# Patient Record
Sex: Male | Born: 1950 | Race: White | Hispanic: No | Marital: Married | Smoking: Current every day smoker
Health system: Southern US, Community
[De-identification: ages and names within clinical notes are randomized; demographics above are authoritative.]

## PROBLEM LIST (undated history)

## (undated) DIAGNOSIS — C801 Malignant (primary) neoplasm, unspecified: Secondary | ICD-10-CM

## (undated) DIAGNOSIS — J189 Pneumonia, unspecified organism: Secondary | ICD-10-CM

## (undated) DIAGNOSIS — M109 Gout, unspecified: Secondary | ICD-10-CM

## (undated) DIAGNOSIS — F410 Panic disorder [episodic paroxysmal anxiety] without agoraphobia: Secondary | ICD-10-CM

## (undated) DIAGNOSIS — T8859XA Other complications of anesthesia, initial encounter: Secondary | ICD-10-CM

## (undated) DIAGNOSIS — K219 Gastro-esophageal reflux disease without esophagitis: Secondary | ICD-10-CM

## (undated) DIAGNOSIS — M1990 Unspecified osteoarthritis, unspecified site: Secondary | ICD-10-CM

## (undated) DIAGNOSIS — E785 Hyperlipidemia, unspecified: Secondary | ICD-10-CM

## (undated) HISTORY — DX: Hyperlipidemia, unspecified: E78.5

## (undated) HISTORY — PX: COLONOSCOPY: SHX174

## (undated) HISTORY — PX: CARDIAC CATHETERIZATION: SHX172

## (undated) HISTORY — PX: CATARACT EXTRACTION: SUR2

---

## 2007-12-26 ENCOUNTER — Ambulatory Visit: Payer: Self-pay | Admitting: Gastroenterology

## 2008-01-09 ENCOUNTER — Ambulatory Visit: Payer: Self-pay | Admitting: Gastroenterology

## 2017-07-25 DIAGNOSIS — E7800 Pure hypercholesterolemia, unspecified: Secondary | ICD-10-CM | POA: Diagnosis not present

## 2017-07-25 DIAGNOSIS — R82998 Other abnormal findings in urine: Secondary | ICD-10-CM | POA: Diagnosis not present

## 2017-07-25 DIAGNOSIS — M109 Gout, unspecified: Secondary | ICD-10-CM | POA: Diagnosis not present

## 2017-08-01 DIAGNOSIS — Z0000 Encounter for general adult medical examination without abnormal findings: Secondary | ICD-10-CM | POA: Diagnosis not present

## 2017-08-01 DIAGNOSIS — M1000 Idiopathic gout, unspecified site: Secondary | ICD-10-CM | POA: Diagnosis not present

## 2017-08-01 DIAGNOSIS — E668 Other obesity: Secondary | ICD-10-CM | POA: Diagnosis not present

## 2017-08-01 DIAGNOSIS — Z23 Encounter for immunization: Secondary | ICD-10-CM | POA: Diagnosis not present

## 2017-08-01 DIAGNOSIS — R7301 Impaired fasting glucose: Secondary | ICD-10-CM | POA: Diagnosis not present

## 2017-08-01 DIAGNOSIS — Z6830 Body mass index (BMI) 30.0-30.9, adult: Secondary | ICD-10-CM | POA: Diagnosis not present

## 2017-08-01 DIAGNOSIS — E7800 Pure hypercholesterolemia, unspecified: Secondary | ICD-10-CM | POA: Diagnosis not present

## 2017-08-01 DIAGNOSIS — R69 Illness, unspecified: Secondary | ICD-10-CM | POA: Diagnosis not present

## 2017-08-01 DIAGNOSIS — Z1389 Encounter for screening for other disorder: Secondary | ICD-10-CM | POA: Diagnosis not present

## 2017-08-07 DIAGNOSIS — R69 Illness, unspecified: Secondary | ICD-10-CM | POA: Diagnosis not present

## 2017-08-08 DIAGNOSIS — Z1212 Encounter for screening for malignant neoplasm of rectum: Secondary | ICD-10-CM | POA: Diagnosis not present

## 2017-08-29 DIAGNOSIS — H93293 Other abnormal auditory perceptions, bilateral: Secondary | ICD-10-CM | POA: Diagnosis not present

## 2017-10-23 DIAGNOSIS — M109 Gout, unspecified: Secondary | ICD-10-CM | POA: Diagnosis not present

## 2017-10-23 DIAGNOSIS — Z8249 Family history of ischemic heart disease and other diseases of the circulatory system: Secondary | ICD-10-CM | POA: Diagnosis not present

## 2017-10-23 DIAGNOSIS — R69 Illness, unspecified: Secondary | ICD-10-CM | POA: Diagnosis not present

## 2017-10-23 DIAGNOSIS — E785 Hyperlipidemia, unspecified: Secondary | ICD-10-CM | POA: Diagnosis not present

## 2017-10-23 DIAGNOSIS — Z7982 Long term (current) use of aspirin: Secondary | ICD-10-CM | POA: Diagnosis not present

## 2017-10-23 DIAGNOSIS — Z823 Family history of stroke: Secondary | ICD-10-CM | POA: Diagnosis not present

## 2017-10-23 DIAGNOSIS — Z882 Allergy status to sulfonamides status: Secondary | ICD-10-CM | POA: Diagnosis not present

## 2017-10-23 DIAGNOSIS — Z833 Family history of diabetes mellitus: Secondary | ICD-10-CM | POA: Diagnosis not present

## 2017-11-25 DIAGNOSIS — H524 Presbyopia: Secondary | ICD-10-CM | POA: Diagnosis not present

## 2017-12-14 DIAGNOSIS — R69 Illness, unspecified: Secondary | ICD-10-CM | POA: Diagnosis not present

## 2017-12-21 ENCOUNTER — Encounter: Payer: Self-pay | Admitting: Gastroenterology

## 2018-02-06 ENCOUNTER — Encounter: Payer: Self-pay | Admitting: Gastroenterology

## 2018-03-20 ENCOUNTER — Ambulatory Visit (AMBULATORY_SURGERY_CENTER): Payer: Self-pay | Admitting: *Deleted

## 2018-03-20 ENCOUNTER — Encounter: Payer: Self-pay | Admitting: Gastroenterology

## 2018-03-20 ENCOUNTER — Other Ambulatory Visit: Payer: Self-pay

## 2018-03-20 VITALS — Ht 68.0 in | Wt 191.4 lb

## 2018-03-20 DIAGNOSIS — Z1211 Encounter for screening for malignant neoplasm of colon: Secondary | ICD-10-CM

## 2018-03-20 NOTE — Progress Notes (Signed)
No egg or soy allergy known to patient  No issues with past sedation with any surgeries  or procedures, no intubation problems  No diet pills per patient No home 02 use per patient  No blood thinners per patient  Pt denies issues with constipation  No A fib or A flutter  EMMI video sent to pt's e mail  

## 2018-04-02 ENCOUNTER — Encounter: Payer: Self-pay | Admitting: Gastroenterology

## 2018-04-03 ENCOUNTER — Ambulatory Visit (AMBULATORY_SURGERY_CENTER): Payer: Medicare HMO | Admitting: Gastroenterology

## 2018-04-03 ENCOUNTER — Encounter: Payer: Self-pay | Admitting: Gastroenterology

## 2018-04-03 VITALS — BP 105/73 | HR 74 | Temp 98.4°F | Resp 10 | Ht 68.0 in | Wt 191.0 lb

## 2018-04-03 DIAGNOSIS — Z1211 Encounter for screening for malignant neoplasm of colon: Secondary | ICD-10-CM | POA: Diagnosis not present

## 2018-04-03 DIAGNOSIS — E669 Obesity, unspecified: Secondary | ICD-10-CM | POA: Diagnosis not present

## 2018-04-03 DIAGNOSIS — D120 Benign neoplasm of cecum: Secondary | ICD-10-CM | POA: Diagnosis not present

## 2018-04-03 MED ORDER — SODIUM CHLORIDE 0.9 % IV SOLN: 500.0000 mL | Freq: Once | INTRAVENOUS | Status: DC

## 2018-04-03 NOTE — Progress Notes (Signed)
Report to PACU, RN, vss, BBS= Clear.  

## 2018-04-03 NOTE — Patient Instructions (Signed)
*  Handout on polyps, diverticulosis, hemorrhoids given*   YOU HAD AN ENDOSCOPIC PROCEDURE TODAY AT [facility]:   Refer to the procedure report that was given to you for any specific questions about what was found during the examination.  If the procedure report does not answer your questions, please call your gastroenterologist to clarify.  If you requested that your care partner not be given the details of your procedure findings, then the procedure report has been included in a sealed envelope for you to review at your convenience later.  YOU SHOULD EXPECT: Some feelings of bloating in the abdomen. Passage of more gas than usual.  Walking can help get rid of the air that was put into your GI tract during the procedure and reduce the bloating. If you had a lower endoscopy (such as a colonoscopy or flexible sigmoidoscopy) you may notice spotting of blood in your stool or on the toilet paper. If you underwent a bowel prep for your procedure, you may not have a normal bowel movement for a few days.  Please Note:  You might notice some irritation and congestion in your nose or some drainage.  This is from the oxygen used during your procedure.  There is no need for concern and it should clear up in a day or so.  SYMPTOMS TO REPORT IMMEDIATELY:   Following lower endoscopy (colonoscopy or flexible sigmoidoscopy):  Excessive amounts of blood in the stool  Significant tenderness or worsening of abdominal pains  Swelling of the abdomen that is new, acute  Fever of 100F or higher   For urgent or emergent issues, a gastroenterologist can be reached at any hour by calling [phone].   DIET:  We do recommend a small meal at first, but then you may proceed to your regular diet.  Drink plenty of fluids but you should avoid alcoholic beverages for 24 hours.  ACTIVITY:  You should plan to take it easy for the rest of today and you should NOT DRIVE or use heavy machinery until tomorrow  (because of the sedation medicines used during the test).    FOLLOW UP: Our staff will call the number listed on your records the next business day following your procedure to check on you and address any questions or concerns that you may have regarding the information given to you following your procedure. If we do not reach you, we will leave a message.  However, if you are feeling well and you are not experiencing any problems, there is no need to return our call.  We will assume that you have returned to your regular daily activities without incident.  If any biopsies were taken you will be contacted by phone or by letter within the next 1-3 weeks.  Please call us at [phone] if you have not heard about the biopsies in 3 weeks.    SIGNATURES/CONFIDENTIALITY: You and/or your care partner have signed paperwork which will be entered into your electronic medical record.  These signatures attest to the fact that that the information above on your After Visit Summary has been reviewed and is understood.  Full responsibility of the confidentiality of this discharge information lies with you and/or your care-partner.

## 2018-04-03 NOTE — Op Note (Signed)
[facility] Patient Name: Jacob Cox Procedure Date: 04/03/2018 7:57 AM MRN: [id number] Endoscopist: Remo Lipps P. Havery Moros , MD Age: 67 Referring MD:  Date of Birth: 26-Feb-1951 Gender: Male Account #: [id number] Procedure:                Colonoscopy Indications:              Screening for colorectal malignant neoplasm Medicines:                Monitored Anesthesia Care Procedure:                Pre-Anesthesia Assessment:                           - Prior to the procedure, a History and Physical                            was performed, and patient medications and                            allergies were reviewed. The patient's tolerance of                            previous anesthesia was also reviewed. The risks                            and benefits of the procedure and the sedation                            options and risks were discussed with the patient.                            All questions were answered, and informed consent                            was obtained. Prior Anticoagulants: The patient has                            taken no previous anticoagulant or antiplatelet                            agents. ASA Grade Assessment: II - A patient with                            mild systemic disease. After reviewing the risks                            and benefits, the patient was deemed in                            satisfactory condition to undergo the procedure.                           After obtaining informed consent, the colonoscope  was passed under direct vision. Throughout the                            procedure, the patient's blood pressure, pulse, and                            oxygen saturations were monitored continuously. The                            Colonoscope was introduced through the anus and                            advanced to the the cecum, identified by                            appendiceal orifice  and ileocecal valve. The                            colonoscopy was performed without difficulty. The                            patient tolerated the procedure well. The quality                            of the bowel preparation was adequate. The                            ileocecal valve, appendiceal orifice, and rectum                            were photographed. Scope In: 7:59:26 AM Scope Out: 8:24:13 AM Scope Withdrawal Time: 0 hours 20 minutes 58 seconds  Total Procedure Duration: 0 hours 24 minutes 47 seconds  Findings:                 The perianal and digital rectal examinations were                            normal.                           A 3 mm polyp was found in the cecum. The polyp was                            sessile. The polyp was removed with a cold snare.                            Resection and retrieval were complete.                           A 8 mm polyp was found in the cecum. The polyp was                            flat. The polyp was removed with a cold snare.  Resection and retrieval were complete.                           Multiple medium-mouthed diverticula were found in                            the left colon.                           Internal hemorrhoids were found during retroflexion.                           The exam was otherwise without abnormality. Complications:            No immediate complications. Estimated blood loss:                            Minimal. Estimated Blood Loss:     Estimated blood loss was minimal. Impression:               - One 3 mm polyp in the cecum, removed with a cold                            snare. Resected and retrieved.                           - One 8 mm polyp in the cecum, removed with a cold                            snare. Resected and retrieved.                           - Diverticulosis in the left colon.                           - Internal hemorrhoids.                            - The examination was otherwise normal. Recommendation:           - Patient has a contact number available for                            emergencies. The signs and symptoms of potential                            delayed complications were discussed with the                            patient. Return to normal activities tomorrow.                            Written discharge instructions were provided to the                            patient.                           -  Resume previous diet.                           - Continue present medications.                           - Await pathology results.                           - Repeat colonoscopy for surveillance based on                            pathology results. Remo Lipps P. Armbruster, MD 04/03/2018 8:30:11 AM This report has been signed electronically.

## 2018-04-03 NOTE — Progress Notes (Signed)
Called to room to assist during endoscopic procedure.  Patient ID and intended procedure confirmed with present staff. Received instructions for my participation in the procedure from the performing physician.  

## 2018-04-04 ENCOUNTER — Telehealth: Payer: Self-pay

## 2018-04-04 NOTE — Telephone Encounter (Signed)
  Follow up Call-  Call back number 04/03/2018  Post procedure Call Back phone  # [phone]  Permission to leave phone message Yes  Some recent data might be hidden     Patient questions:  Do you have a fever, pain , or abdominal swelling? No. Pain Score  0 *  Have you tolerated food without any problems? Yes.    Have you been able to return to your normal activities? Yes.    Do you have any questions about your discharge instructions: Diet   No. Medications  No. Follow up visit  No.  Do you have questions or concerns about your Care? No.  Actions: * If pain score is 4 or above: No action needed, pain <4.  No problems noted per pt. maw

## 2018-04-04 NOTE — Telephone Encounter (Signed)
Called #[phone] and left a messaged we tried to reach pt for a follow up call. maw

## 2018-04-05 DIAGNOSIS — L821 Other seborrheic keratosis: Secondary | ICD-10-CM | POA: Diagnosis not present

## 2018-04-05 DIAGNOSIS — L570 Actinic keratosis: Secondary | ICD-10-CM | POA: Diagnosis not present

## 2018-04-05 DIAGNOSIS — L820 Inflamed seborrheic keratosis: Secondary | ICD-10-CM | POA: Diagnosis not present

## 2018-04-05 DIAGNOSIS — Z23 Encounter for immunization: Secondary | ICD-10-CM | POA: Diagnosis not present

## 2018-04-10 ENCOUNTER — Encounter: Payer: Self-pay | Admitting: Gastroenterology

## 2018-04-26 DIAGNOSIS — R69 Illness, unspecified: Secondary | ICD-10-CM | POA: Diagnosis not present

## 2018-06-26 DIAGNOSIS — B029 Zoster without complications: Secondary | ICD-10-CM | POA: Diagnosis not present

## 2018-08-16 DIAGNOSIS — R972 Elevated prostate specific antigen [PSA]: Secondary | ICD-10-CM | POA: Diagnosis not present

## 2018-08-16 DIAGNOSIS — R82998 Other abnormal findings in urine: Secondary | ICD-10-CM | POA: Diagnosis not present

## 2018-08-16 DIAGNOSIS — R7301 Impaired fasting glucose: Secondary | ICD-10-CM | POA: Diagnosis not present

## 2018-08-16 DIAGNOSIS — Z125 Encounter for screening for malignant neoplasm of prostate: Secondary | ICD-10-CM | POA: Diagnosis not present

## 2018-08-16 DIAGNOSIS — M109 Gout, unspecified: Secondary | ICD-10-CM | POA: Diagnosis not present

## 2018-08-16 DIAGNOSIS — E7800 Pure hypercholesterolemia, unspecified: Secondary | ICD-10-CM | POA: Diagnosis not present

## 2018-08-23 DIAGNOSIS — Z23 Encounter for immunization: Secondary | ICD-10-CM | POA: Diagnosis not present

## 2018-08-23 DIAGNOSIS — Z0000 Encounter for general adult medical examination without abnormal findings: Secondary | ICD-10-CM | POA: Diagnosis not present

## 2018-08-23 DIAGNOSIS — Z1331 Encounter for screening for depression: Secondary | ICD-10-CM | POA: Diagnosis not present

## 2018-08-23 DIAGNOSIS — R69 Illness, unspecified: Secondary | ICD-10-CM | POA: Diagnosis not present

## 2018-08-23 DIAGNOSIS — R972 Elevated prostate specific antigen [PSA]: Secondary | ICD-10-CM | POA: Diagnosis not present

## 2018-08-23 DIAGNOSIS — M109 Gout, unspecified: Secondary | ICD-10-CM | POA: Diagnosis not present

## 2018-08-23 DIAGNOSIS — E668 Other obesity: Secondary | ICD-10-CM | POA: Diagnosis not present

## 2018-08-23 DIAGNOSIS — R7301 Impaired fasting glucose: Secondary | ICD-10-CM | POA: Diagnosis not present

## 2018-08-23 DIAGNOSIS — H9193 Unspecified hearing loss, bilateral: Secondary | ICD-10-CM | POA: Diagnosis not present

## 2018-08-23 DIAGNOSIS — E7800 Pure hypercholesterolemia, unspecified: Secondary | ICD-10-CM | POA: Diagnosis not present

## 2018-08-24 DIAGNOSIS — Z1212 Encounter for screening for malignant neoplasm of rectum: Secondary | ICD-10-CM | POA: Diagnosis not present

## 2018-09-06 DIAGNOSIS — R69 Illness, unspecified: Secondary | ICD-10-CM | POA: Diagnosis not present

## 2018-09-24 DIAGNOSIS — R972 Elevated prostate specific antigen [PSA]: Secondary | ICD-10-CM | POA: Diagnosis not present

## 2019-01-15 DIAGNOSIS — R69 Illness, unspecified: Secondary | ICD-10-CM | POA: Diagnosis not present

## 2019-02-13 DIAGNOSIS — B349 Viral infection, unspecified: Secondary | ICD-10-CM | POA: Diagnosis not present

## 2019-02-13 DIAGNOSIS — R0600 Dyspnea, unspecified: Secondary | ICD-10-CM | POA: Diagnosis not present

## 2019-04-03 DIAGNOSIS — R69 Illness, unspecified: Secondary | ICD-10-CM | POA: Diagnosis not present

## 2019-04-13 DIAGNOSIS — Z23 Encounter for immunization: Secondary | ICD-10-CM | POA: Diagnosis not present

## 2019-04-16 DIAGNOSIS — R69 Illness, unspecified: Secondary | ICD-10-CM | POA: Diagnosis not present

## 2019-04-22 DIAGNOSIS — R69 Illness, unspecified: Secondary | ICD-10-CM | POA: Diagnosis not present

## 2019-05-21 DIAGNOSIS — R69 Illness, unspecified: Secondary | ICD-10-CM | POA: Diagnosis not present

## 2019-08-26 DIAGNOSIS — M109 Gout, unspecified: Secondary | ICD-10-CM | POA: Diagnosis not present

## 2019-08-26 DIAGNOSIS — E7800 Pure hypercholesterolemia, unspecified: Secondary | ICD-10-CM | POA: Diagnosis not present

## 2019-08-26 DIAGNOSIS — Z125 Encounter for screening for malignant neoplasm of prostate: Secondary | ICD-10-CM | POA: Diagnosis not present

## 2019-08-26 DIAGNOSIS — R7301 Impaired fasting glucose: Secondary | ICD-10-CM | POA: Diagnosis not present

## 2019-08-26 DIAGNOSIS — Z0000 Encounter for general adult medical examination without abnormal findings: Secondary | ICD-10-CM | POA: Diagnosis not present

## 2019-08-28 DIAGNOSIS — Z1212 Encounter for screening for malignant neoplasm of rectum: Secondary | ICD-10-CM | POA: Diagnosis not present

## 2019-08-28 DIAGNOSIS — R82998 Other abnormal findings in urine: Secondary | ICD-10-CM | POA: Diagnosis not present

## 2019-08-29 DIAGNOSIS — R69 Illness, unspecified: Secondary | ICD-10-CM | POA: Diagnosis not present

## 2019-09-02 DIAGNOSIS — R7301 Impaired fasting glucose: Secondary | ICD-10-CM | POA: Diagnosis not present

## 2019-09-02 DIAGNOSIS — R69 Illness, unspecified: Secondary | ICD-10-CM | POA: Diagnosis not present

## 2019-09-02 DIAGNOSIS — M109 Gout, unspecified: Secondary | ICD-10-CM | POA: Diagnosis not present

## 2019-09-02 DIAGNOSIS — Z0000 Encounter for general adult medical examination without abnormal findings: Secondary | ICD-10-CM | POA: Diagnosis not present

## 2019-09-02 DIAGNOSIS — E669 Obesity, unspecified: Secondary | ICD-10-CM | POA: Diagnosis not present

## 2019-09-02 DIAGNOSIS — H9193 Unspecified hearing loss, bilateral: Secondary | ICD-10-CM | POA: Diagnosis not present

## 2019-09-02 DIAGNOSIS — Z7689 Persons encountering health services in other specified circumstances: Secondary | ICD-10-CM | POA: Diagnosis not present

## 2019-09-02 DIAGNOSIS — M1990 Unspecified osteoarthritis, unspecified site: Secondary | ICD-10-CM | POA: Diagnosis not present

## 2019-09-02 DIAGNOSIS — N390 Urinary tract infection, site not specified: Secondary | ICD-10-CM | POA: Diagnosis not present

## 2019-09-02 DIAGNOSIS — E7800 Pure hypercholesterolemia, unspecified: Secondary | ICD-10-CM | POA: Diagnosis not present

## 2019-09-02 DIAGNOSIS — E663 Overweight: Secondary | ICD-10-CM | POA: Diagnosis not present

## 2019-09-05 ENCOUNTER — Ambulatory Visit: Payer: Medicare HMO | Attending: Internal Medicine

## 2019-09-05 DIAGNOSIS — Z23 Encounter for immunization: Secondary | ICD-10-CM | POA: Insufficient documentation

## 2019-09-05 NOTE — Progress Notes (Signed)
   Covid-19 Vaccination Clinic  Name:  RODERICH COCKCROFT    MRN: [id number] DOB: 1950/09/27  09/05/2019  Mr. Hubley was observed post Covid-19 immunization for 15 minutes without incident. He was provided with Vaccine Information Sheet and instruction to access the V-Safe system.   Mr. Garthwaite was instructed to call 911 with any severe reactions post vaccine: Marland Kitchen Difficulty breathing  . Swelling of face and throat  . A fast heartbeat  . A bad rash all over body  . Dizziness and weakness

## 2019-09-17 DIAGNOSIS — E785 Hyperlipidemia, unspecified: Secondary | ICD-10-CM | POA: Diagnosis not present

## 2019-09-17 DIAGNOSIS — K219 Gastro-esophageal reflux disease without esophagitis: Secondary | ICD-10-CM | POA: Diagnosis not present

## 2019-09-17 DIAGNOSIS — Z882 Allergy status to sulfonamides status: Secondary | ICD-10-CM | POA: Diagnosis not present

## 2019-09-17 DIAGNOSIS — R69 Illness, unspecified: Secondary | ICD-10-CM | POA: Diagnosis not present

## 2019-09-17 DIAGNOSIS — Z823 Family history of stroke: Secondary | ICD-10-CM | POA: Diagnosis not present

## 2019-09-17 DIAGNOSIS — Z7982 Long term (current) use of aspirin: Secondary | ICD-10-CM | POA: Diagnosis not present

## 2019-09-17 DIAGNOSIS — R030 Elevated blood-pressure reading, without diagnosis of hypertension: Secondary | ICD-10-CM | POA: Diagnosis not present

## 2019-09-17 DIAGNOSIS — M109 Gout, unspecified: Secondary | ICD-10-CM | POA: Diagnosis not present

## 2019-10-01 ENCOUNTER — Ambulatory Visit: Payer: Medicare HMO | Attending: Internal Medicine

## 2019-10-01 DIAGNOSIS — Z23 Encounter for immunization: Secondary | ICD-10-CM

## 2019-10-01 NOTE — Progress Notes (Signed)
   Covid-19 Vaccination Clinic  Name:  Jacob Cox    MRN: [id number] DOB: 10/29/1950  10/01/2019  [name] was observed post Covid-19 immunization for 15 minutes without incident. He was provided with Vaccine Information Sheet and instruction to access the V-Safe system.   [name] was instructed to call 911 with any severe reactions post vaccine: Marland Kitchen Difficulty breathing  . Swelling of face and throat  . A fast heartbeat  . A bad rash all over body  . Dizziness and weakness   Immunizations Administered    Name Date Dose VIS Date Route   Pfizer COVID-19 Vaccine 10/01/2019 11:26 AM 0.3 mL 06/14/2019 Intramuscular   Manufacturer: [place]   Lot: [id number]   [place]: [id number]

## 2019-10-26 DIAGNOSIS — H524 Presbyopia: Secondary | ICD-10-CM | POA: Diagnosis not present

## 2019-11-11 DIAGNOSIS — R69 Illness, unspecified: Secondary | ICD-10-CM | POA: Diagnosis not present

## 2020-02-19 DIAGNOSIS — R69 Illness, unspecified: Secondary | ICD-10-CM | POA: Diagnosis not present

## 2020-04-10 DIAGNOSIS — Z23 Encounter for immunization: Secondary | ICD-10-CM | POA: Diagnosis not present

## 2020-05-27 DIAGNOSIS — R69 Illness, unspecified: Secondary | ICD-10-CM | POA: Diagnosis not present

## 2020-06-22 DIAGNOSIS — D0339 Melanoma in situ of other parts of face: Secondary | ICD-10-CM | POA: Diagnosis not present

## 2020-06-22 DIAGNOSIS — D0439 Carcinoma in situ of skin of other parts of face: Secondary | ICD-10-CM | POA: Diagnosis not present

## 2020-06-22 DIAGNOSIS — D225 Melanocytic nevi of trunk: Secondary | ICD-10-CM | POA: Diagnosis not present

## 2020-06-22 DIAGNOSIS — D034 Melanoma in situ of scalp and neck: Secondary | ICD-10-CM | POA: Diagnosis not present

## 2020-07-23 DIAGNOSIS — D485 Neoplasm of uncertain behavior of skin: Secondary | ICD-10-CM | POA: Diagnosis not present

## 2020-07-23 DIAGNOSIS — D0330 Melanoma in situ of unspecified part of face: Secondary | ICD-10-CM | POA: Diagnosis not present

## 2020-08-07 DIAGNOSIS — Z8249 Family history of ischemic heart disease and other diseases of the circulatory system: Secondary | ICD-10-CM | POA: Diagnosis not present

## 2020-08-07 DIAGNOSIS — E663 Overweight: Secondary | ICD-10-CM | POA: Diagnosis not present

## 2020-08-07 DIAGNOSIS — E785 Hyperlipidemia, unspecified: Secondary | ICD-10-CM | POA: Diagnosis not present

## 2020-08-07 DIAGNOSIS — R69 Illness, unspecified: Secondary | ICD-10-CM | POA: Diagnosis not present

## 2020-08-07 DIAGNOSIS — Z809 Family history of malignant neoplasm, unspecified: Secondary | ICD-10-CM | POA: Diagnosis not present

## 2020-08-07 DIAGNOSIS — Z823 Family history of stroke: Secondary | ICD-10-CM | POA: Diagnosis not present

## 2020-08-07 DIAGNOSIS — M1990 Unspecified osteoarthritis, unspecified site: Secondary | ICD-10-CM | POA: Diagnosis not present

## 2020-08-07 DIAGNOSIS — Z7982 Long term (current) use of aspirin: Secondary | ICD-10-CM | POA: Diagnosis not present

## 2020-08-07 DIAGNOSIS — M109 Gout, unspecified: Secondary | ICD-10-CM | POA: Diagnosis not present

## 2020-08-31 DIAGNOSIS — M109 Gout, unspecified: Secondary | ICD-10-CM | POA: Diagnosis not present

## 2020-08-31 DIAGNOSIS — E7800 Pure hypercholesterolemia, unspecified: Secondary | ICD-10-CM | POA: Diagnosis not present

## 2020-08-31 DIAGNOSIS — Z125 Encounter for screening for malignant neoplasm of prostate: Secondary | ICD-10-CM | POA: Diagnosis not present

## 2020-08-31 DIAGNOSIS — R7301 Impaired fasting glucose: Secondary | ICD-10-CM | POA: Diagnosis not present

## 2020-09-01 DIAGNOSIS — Z0100 Encounter for examination of eyes and vision without abnormal findings: Secondary | ICD-10-CM | POA: Diagnosis not present

## 2020-09-01 DIAGNOSIS — H524 Presbyopia: Secondary | ICD-10-CM | POA: Diagnosis not present

## 2020-09-07 DIAGNOSIS — M109 Gout, unspecified: Secondary | ICD-10-CM | POA: Diagnosis not present

## 2020-09-07 DIAGNOSIS — Z0000 Encounter for general adult medical examination without abnormal findings: Secondary | ICD-10-CM | POA: Diagnosis not present

## 2020-09-07 DIAGNOSIS — Z1212 Encounter for screening for malignant neoplasm of rectum: Secondary | ICD-10-CM | POA: Diagnosis not present

## 2020-09-07 DIAGNOSIS — H9193 Unspecified hearing loss, bilateral: Secondary | ICD-10-CM | POA: Diagnosis not present

## 2020-09-07 DIAGNOSIS — E663 Overweight: Secondary | ICD-10-CM | POA: Diagnosis not present

## 2020-09-07 DIAGNOSIS — R7301 Impaired fasting glucose: Secondary | ICD-10-CM | POA: Diagnosis not present

## 2020-09-07 DIAGNOSIS — M1990 Unspecified osteoarthritis, unspecified site: Secondary | ICD-10-CM | POA: Diagnosis not present

## 2020-09-07 DIAGNOSIS — R82998 Other abnormal findings in urine: Secondary | ICD-10-CM | POA: Diagnosis not present

## 2020-09-07 DIAGNOSIS — E7800 Pure hypercholesterolemia, unspecified: Secondary | ICD-10-CM | POA: Diagnosis not present

## 2020-09-07 DIAGNOSIS — G4733 Obstructive sleep apnea (adult) (pediatric): Secondary | ICD-10-CM | POA: Diagnosis not present

## 2020-09-07 DIAGNOSIS — R69 Illness, unspecified: Secondary | ICD-10-CM | POA: Diagnosis not present

## 2020-09-07 DIAGNOSIS — R9431 Abnormal electrocardiogram [ECG] [EKG]: Secondary | ICD-10-CM | POA: Diagnosis not present

## 2020-09-19 DIAGNOSIS — H524 Presbyopia: Secondary | ICD-10-CM | POA: Diagnosis not present

## 2020-09-29 DIAGNOSIS — D1801 Hemangioma of skin and subcutaneous tissue: Secondary | ICD-10-CM | POA: Diagnosis not present

## 2020-09-29 DIAGNOSIS — D225 Melanocytic nevi of trunk: Secondary | ICD-10-CM | POA: Diagnosis not present

## 2020-09-29 DIAGNOSIS — Z85820 Personal history of malignant melanoma of skin: Secondary | ICD-10-CM | POA: Diagnosis not present

## 2020-09-29 DIAGNOSIS — L820 Inflamed seborrheic keratosis: Secondary | ICD-10-CM | POA: Diagnosis not present

## 2020-09-29 DIAGNOSIS — L245 Irritant contact dermatitis due to other chemical products: Secondary | ICD-10-CM | POA: Diagnosis not present

## 2020-09-29 DIAGNOSIS — D485 Neoplasm of uncertain behavior of skin: Secondary | ICD-10-CM | POA: Diagnosis not present

## 2020-09-29 DIAGNOSIS — L821 Other seborrheic keratosis: Secondary | ICD-10-CM | POA: Diagnosis not present

## 2020-09-29 DIAGNOSIS — Z85828 Personal history of other malignant neoplasm of skin: Secondary | ICD-10-CM | POA: Diagnosis not present

## 2021-01-01 DIAGNOSIS — D2372 Other benign neoplasm of skin of left lower limb, including hip: Secondary | ICD-10-CM | POA: Diagnosis not present

## 2021-01-01 DIAGNOSIS — Z85820 Personal history of malignant melanoma of skin: Secondary | ICD-10-CM | POA: Diagnosis not present

## 2021-01-01 DIAGNOSIS — L738 Other specified follicular disorders: Secondary | ICD-10-CM | POA: Diagnosis not present

## 2021-01-01 DIAGNOSIS — L814 Other melanin hyperpigmentation: Secondary | ICD-10-CM | POA: Diagnosis not present

## 2021-01-01 DIAGNOSIS — D485 Neoplasm of uncertain behavior of skin: Secondary | ICD-10-CM | POA: Diagnosis not present

## 2021-01-01 DIAGNOSIS — D2262 Melanocytic nevi of left upper limb, including shoulder: Secondary | ICD-10-CM | POA: Diagnosis not present

## 2021-01-01 DIAGNOSIS — L821 Other seborrheic keratosis: Secondary | ICD-10-CM | POA: Diagnosis not present

## 2021-01-01 DIAGNOSIS — D2271 Melanocytic nevi of right lower limb, including hip: Secondary | ICD-10-CM | POA: Diagnosis not present

## 2021-01-01 DIAGNOSIS — L988 Other specified disorders of the skin and subcutaneous tissue: Secondary | ICD-10-CM | POA: Diagnosis not present

## 2021-01-01 DIAGNOSIS — D1801 Hemangioma of skin and subcutaneous tissue: Secondary | ICD-10-CM | POA: Diagnosis not present

## 2021-05-02 ENCOUNTER — Other Ambulatory Visit: Payer: Self-pay

## 2021-05-02 ENCOUNTER — Encounter (HOSPITAL_BASED_OUTPATIENT_CLINIC_OR_DEPARTMENT_OTHER): Payer: Self-pay

## 2021-05-02 ENCOUNTER — Emergency Department (HOSPITAL_BASED_OUTPATIENT_CLINIC_OR_DEPARTMENT_OTHER)
Admission: EM | Admit: 2021-05-02 | Discharge: 2021-05-02 | Disposition: A | Discharge status: Home / Self Care | Payer: Medicare HMO | Attending: Emergency Medicine | Admitting: Emergency Medicine

## 2021-05-02 ENCOUNTER — Emergency Department (HOSPITAL_BASED_OUTPATIENT_CLINIC_OR_DEPARTMENT_OTHER): Payer: Medicare HMO | Admitting: Radiology

## 2021-05-02 DIAGNOSIS — R079 Chest pain, unspecified: Secondary | ICD-10-CM | POA: Insufficient documentation

## 2021-05-02 DIAGNOSIS — R0789 Other chest pain: Secondary | ICD-10-CM | POA: Diagnosis not present

## 2021-05-02 DIAGNOSIS — Z7982 Long term (current) use of aspirin: Secondary | ICD-10-CM | POA: Diagnosis not present

## 2021-05-02 DIAGNOSIS — Z743 Need for continuous supervision: Secondary | ICD-10-CM | POA: Diagnosis not present

## 2021-05-02 DIAGNOSIS — F17290 Nicotine dependence, other tobacco product, uncomplicated: Secondary | ICD-10-CM | POA: Insufficient documentation

## 2021-05-02 DIAGNOSIS — R040 Epistaxis: Secondary | ICD-10-CM | POA: Insufficient documentation

## 2021-05-02 DIAGNOSIS — R69 Illness, unspecified: Secondary | ICD-10-CM | POA: Diagnosis not present

## 2021-05-02 DIAGNOSIS — I10 Essential (primary) hypertension: Secondary | ICD-10-CM | POA: Diagnosis not present

## 2021-05-02 DIAGNOSIS — R58 Hemorrhage, not elsewhere classified: Secondary | ICD-10-CM | POA: Diagnosis not present

## 2021-05-02 HISTORY — DX: Panic disorder (episodic paroxysmal anxiety): F41.0

## 2021-05-02 LAB — TROPONIN I (HIGH SENSITIVITY)
Troponin I (High Sensitivity): 3 ng/L (ref <18)
Troponin I (High Sensitivity): 3 ng/L (ref <18)

## 2021-05-02 LAB — CBC
HCT: 44.2 % (ref 39.0–52.0)
Hemoglobin: 15.2 g/dL (ref 13.0–17.0)
MCH: 32.5 pg (ref 26.0–34.0)
MCHC: 34.4 g/dL (ref 30.0–36.0)
MCV: 94.4 fL (ref 80.0–100.0)
Platelets: 191 10*3/uL (ref 150–400)
RBC: 4.68 MIL/uL (ref 4.22–5.81)
RDW: 12.2 % (ref 11.5–15.5)
WBC: 8.8 10*3/uL (ref 4.0–10.5)
nRBC: 0 % (ref 0.0–0.2)

## 2021-05-02 LAB — BASIC METABOLIC PANEL
Anion gap: 13 (ref 5–15)
BUN: 21 mg/dL (ref 8–23)
CO2: 21 mmol/L — ABNORMAL LOW (ref 22–32)
Calcium: 9.1 mg/dL (ref 8.9–10.3)
Chloride: 105 mmol/L (ref 98–111)
Creatinine, Ser: 1.34 mg/dL — ABNORMAL HIGH (ref 0.61–1.24)
GFR, Estimated: 58 mL/min — ABNORMAL LOW (ref >60)
Glucose, Bld: 91 mg/dL (ref 70–99)
Potassium: 4.2 mmol/L (ref 3.5–5.1)
Sodium: 139 mmol/L (ref 135–145)

## 2021-05-02 NOTE — ED Triage Notes (Signed)
Pt now reports he's experiencing intermittent eft side chest pain and a tension headache he feels is radiating down into his chest

## 2021-05-02 NOTE — ED Provider Notes (Signed)
[facility] EMERGENCY DEPT Provider Note   CSN: [id number] Arrival date & time: 05/02/21  1517     History Chief Complaint  Patient presents with   Epistaxis   Chest Pain    Jacob Cox is a 70 y.o. male.  Presents ER chief complaint of bloody nose.  He states he was sitting at his desk when he suddenly noticed dripping noticed blood coming from both nostrils.  He tried to pinch his nose but could not stop the bleeding so called the ambulance and was brought to the ER.  Upon arrival here he developed chest pain described as left-sided chest pain the last for 5 to 10 minutes and then has since resolved.  Otherwise denies any recent illnesses such as fevers or cough or vomiting or diarrhea.  Denies any nasal trauma.      Past Medical History:  Diagnosis Date   Hyperlipidemia    Panic attacks     There are no problems to display for this patient.   Past Surgical History:  Procedure Laterality Date   CARDIAC CATHETERIZATION         Family History  Problem Relation Age of Onset   Colon cancer Neg Hx    Esophageal cancer Neg Hx    Stomach cancer Neg Hx    Esophageal varices Neg Hx     Social History   Tobacco Use   Smoking status: Every Day    Types: Cigars   Smokeless tobacco: Never  Substance Use Topics   Alcohol use: Yes    Comment: 7-8 oz of alcohol per week   Drug use: Never    Home Medications Prior to Admission medications   Medication Sig Start Date End Date Taking? Authorizing Provider  allopurinol (ZYLOPRIM) 100 MG tablet Take 100 mg by mouth daily.    [provider]  aspirin EC 81 MG tablet Take 81 mg by mouth daily.    [provider]  Coenzyme Q10 (COQ10) 200 MG CAPS Take by mouth.    [provider]  Multiple Vitamin (MULTIVITAMIN) tablet Take 1 tablet by mouth daily.    [provider]  Multiple Vitamins-Minerals (ZINC PO) Take by mouth.    [provider]  Omega-3 Fatty Acids  (FISH OIL PO) Take by mouth.    [provider]  simvastatin (ZOCOR) 40 MG tablet Take 40 mg by mouth daily.    [provider]  UNABLE TO FIND Chiroklenz tea with psyllium husk every pm    [provider]    Allergies    Sulfonamide derivatives  Review of Systems   Review of Systems  Constitutional:  Negative for fever.  HENT:  Negative for ear pain and sore throat.   Eyes:  Negative for pain.  Respiratory:  Negative for cough.   Cardiovascular:  Positive for chest pain.  Gastrointestinal:  Negative for abdominal pain.  Genitourinary:  Negative for flank pain.  Musculoskeletal:  Negative for back pain.  Skin:  Negative for color change and rash.  Neurological:  Negative for syncope.  All other systems reviewed and are negative.  Physical Exam Updated Vital Signs BP (!) 137/93   Pulse 83   Temp 97.9 F (36.6 C)   Resp 18   Ht 5\' 8"  (1.727 m)   Wt 84.4 kg   SpO2 99%   BMI 28.28 kg/m   Physical Exam Constitutional:      Appearance: He is well-developed.  HENT:     Head:  Normocephalic.     Nose: Nose normal.     Comments: No active bleeding seen in bilateral nostrils. Eyes:     Extraocular Movements: Extraocular movements intact.  Cardiovascular:     Rate and Rhythm: Normal rate.  Pulmonary:     Effort: Pulmonary effort is normal.  Skin:    Coloration: Skin is not jaundiced.  Neurological:     Mental Status: He is alert. Mental status is at baseline.    ED Results / Procedures / Treatments   Labs (all labs ordered are listed, but only abnormal results are displayed) Labs Reviewed  BASIC METABOLIC PANEL - Abnormal; Notable for the following components:      Result Value   CO2 21 (*)    Creatinine, Ser 1.34 (*)    GFR, Estimated 58 (*)    All other components within normal limits  CBC  TROPONIN I (HIGH SENSITIVITY)  TROPONIN I (HIGH SENSITIVITY)    EKG EKG Interpretation  Date/Time:  Sunday May 02 2021 16:10:47  EDT Ventricular Rate:  103 PR Interval:  146 QRS Duration: 78 QT Interval:  330 QTC Calculation: 432 R Axis:   34 Text Interpretation: Sinus tachycardia Otherwise normal ECG Confirmed by Thamas Jaegers ([id number]) on 05/02/2021 6:19:39 PM  Radiology DG Chest 2 View  Result Date: 05/02/2021 CLINICAL DATA:  Acute chest pain and nose bleed. EXAM: CHEST - 2 VIEW COMPARISON:  None. FINDINGS: The cardiomediastinal silhouette is unremarkable. There is no evidence of focal airspace disease, pulmonary edema, suspicious pulmonary nodule/mass, pleural effusion, or pneumothorax. No acute bony abnormalities are identified. IMPRESSION: No active cardiopulmonary disease. Electronically Signed   By: Margarette Canada M.D.   On: 05/02/2021 16:30    Procedures Procedures   Medications Ordered in ED Medications - No data to display  ED Course  I have reviewed the triage vital signs and the nursing notes.  Pertinent labs & imaging results that were available during my care of the patient were reviewed by me and considered in my medical decision making (see chart for details).    MDM Rules/Calculators/A&P                           EKG is unremarkable sinus rhythm normal rate no ST elevations or depression seen.  Chest x-ray is unremarkable as well.  Troponin sent x2 negative.  Patient's nose had gauze in it and no longer has any bleeding at this time.  Will be discharged home, advised outpatient follow-up with his doctor within the week, advised immediate return for recurrent bleeding that is unable to be stopped or any additional concerns.  Final Clinical Impression(s) / ED Diagnoses Final diagnoses:  Epistaxis  Chest pain, unspecified type    Rx / DC Orders ED Discharge Orders     None        Luna Fuse, MD 05/02/21 [phone]

## 2021-05-02 NOTE — Discharge Instructions (Signed)
Call your primary care doctor or specialist as discussed in the next 2-3 days.   Return immediately back to the ER if:  Your symptoms worsen within the next 12-24 hours. You develop new symptoms such as new fevers, persistent vomiting, new pain, shortness of breath, or new weakness or numbness, or if you have any other concerns.  

## 2021-05-02 NOTE — ED Triage Notes (Signed)
Pt BIB [facility] for nose bleed starting approx noon today, he was able to hold pressure to stop the bleeding, nose began bleeding again at 1400, unable to control the bleeding this time. EMS placed a gauze bilateral nares , bleeding controlled at this time. Pt is holding pressure.

## 2021-05-02 NOTE — ED Triage Notes (Signed)
Patient reports to the ER for nose bleed since 12 today. Patient reports no hx of HTN  BP with EMS 194/108 HR: 110

## 2021-07-13 DIAGNOSIS — R051 Acute cough: Secondary | ICD-10-CM | POA: Diagnosis not present

## 2021-07-13 DIAGNOSIS — Z1152 Encounter for screening for COVID-19: Secondary | ICD-10-CM | POA: Diagnosis not present

## 2021-07-13 DIAGNOSIS — J189 Pneumonia, unspecified organism: Secondary | ICD-10-CM | POA: Diagnosis not present

## 2021-07-13 DIAGNOSIS — R5383 Other fatigue: Secondary | ICD-10-CM | POA: Diagnosis not present

## 2021-07-13 DIAGNOSIS — R0981 Nasal congestion: Secondary | ICD-10-CM | POA: Diagnosis not present

## 2021-07-13 DIAGNOSIS — R69 Illness, unspecified: Secondary | ICD-10-CM | POA: Diagnosis not present

## 2021-07-13 DIAGNOSIS — G4733 Obstructive sleep apnea (adult) (pediatric): Secondary | ICD-10-CM | POA: Diagnosis not present

## 2021-09-13 DIAGNOSIS — Z125 Encounter for screening for malignant neoplasm of prostate: Secondary | ICD-10-CM | POA: Diagnosis not present

## 2021-09-13 DIAGNOSIS — M109 Gout, unspecified: Secondary | ICD-10-CM | POA: Diagnosis not present

## 2021-09-13 DIAGNOSIS — R7989 Other specified abnormal findings of blood chemistry: Secondary | ICD-10-CM | POA: Diagnosis not present

## 2021-09-13 DIAGNOSIS — R7301 Impaired fasting glucose: Secondary | ICD-10-CM | POA: Diagnosis not present

## 2021-09-13 DIAGNOSIS — E7800 Pure hypercholesterolemia, unspecified: Secondary | ICD-10-CM | POA: Diagnosis not present

## 2021-09-20 DIAGNOSIS — Z1339 Encounter for screening examination for other mental health and behavioral disorders: Secondary | ICD-10-CM | POA: Diagnosis not present

## 2021-09-20 DIAGNOSIS — D0321 Melanoma in situ of right ear and external auricular canal: Secondary | ICD-10-CM | POA: Diagnosis not present

## 2021-09-20 DIAGNOSIS — R9431 Abnormal electrocardiogram [ECG] [EKG]: Secondary | ICD-10-CM | POA: Diagnosis not present

## 2021-09-20 DIAGNOSIS — Z0000 Encounter for general adult medical examination without abnormal findings: Secondary | ICD-10-CM | POA: Diagnosis not present

## 2021-09-20 DIAGNOSIS — R69 Illness, unspecified: Secondary | ICD-10-CM | POA: Diagnosis not present

## 2021-09-20 DIAGNOSIS — E7800 Pure hypercholesterolemia, unspecified: Secondary | ICD-10-CM | POA: Diagnosis not present

## 2021-09-20 DIAGNOSIS — R82998 Other abnormal findings in urine: Secondary | ICD-10-CM | POA: Diagnosis not present

## 2021-09-20 DIAGNOSIS — Z1331 Encounter for screening for depression: Secondary | ICD-10-CM | POA: Diagnosis not present

## 2021-09-20 DIAGNOSIS — R7301 Impaired fasting glucose: Secondary | ICD-10-CM | POA: Diagnosis not present

## 2021-09-20 DIAGNOSIS — M109 Gout, unspecified: Secondary | ICD-10-CM | POA: Diagnosis not present

## 2021-09-20 DIAGNOSIS — M1990 Unspecified osteoarthritis, unspecified site: Secondary | ICD-10-CM | POA: Diagnosis not present

## 2021-09-20 DIAGNOSIS — G4733 Obstructive sleep apnea (adult) (pediatric): Secondary | ICD-10-CM | POA: Diagnosis not present

## 2021-09-20 DIAGNOSIS — E663 Overweight: Secondary | ICD-10-CM | POA: Diagnosis not present

## 2021-12-16 DIAGNOSIS — D692 Other nonthrombocytopenic purpura: Secondary | ICD-10-CM | POA: Diagnosis not present

## 2021-12-16 DIAGNOSIS — L820 Inflamed seborrheic keratosis: Secondary | ICD-10-CM | POA: Diagnosis not present

## 2022-01-18 DIAGNOSIS — Z0100 Encounter for examination of eyes and vision without abnormal findings: Secondary | ICD-10-CM | POA: Diagnosis not present

## 2022-01-18 DIAGNOSIS — H524 Presbyopia: Secondary | ICD-10-CM | POA: Diagnosis not present

## 2022-01-20 DIAGNOSIS — Z85828 Personal history of other malignant neoplasm of skin: Secondary | ICD-10-CM | POA: Diagnosis not present

## 2022-01-20 DIAGNOSIS — L218 Other seborrheic dermatitis: Secondary | ICD-10-CM | POA: Diagnosis not present

## 2022-01-20 DIAGNOSIS — D225 Melanocytic nevi of trunk: Secondary | ICD-10-CM | POA: Diagnosis not present

## 2022-01-20 DIAGNOSIS — D1801 Hemangioma of skin and subcutaneous tissue: Secondary | ICD-10-CM | POA: Diagnosis not present

## 2022-01-20 DIAGNOSIS — L821 Other seborrheic keratosis: Secondary | ICD-10-CM | POA: Diagnosis not present

## 2022-01-20 DIAGNOSIS — Z85820 Personal history of malignant melanoma of skin: Secondary | ICD-10-CM | POA: Diagnosis not present

## 2022-01-20 DIAGNOSIS — L738 Other specified follicular disorders: Secondary | ICD-10-CM | POA: Diagnosis not present

## 2022-01-20 DIAGNOSIS — D2262 Melanocytic nevi of left upper limb, including shoulder: Secondary | ICD-10-CM | POA: Diagnosis not present

## 2022-02-21 DIAGNOSIS — Z882 Allergy status to sulfonamides status: Secondary | ICD-10-CM | POA: Diagnosis not present

## 2022-02-21 DIAGNOSIS — E785 Hyperlipidemia, unspecified: Secondary | ICD-10-CM | POA: Diagnosis not present

## 2022-02-21 DIAGNOSIS — R030 Elevated blood-pressure reading, without diagnosis of hypertension: Secondary | ICD-10-CM | POA: Diagnosis not present

## 2022-02-21 DIAGNOSIS — Z85828 Personal history of other malignant neoplasm of skin: Secondary | ICD-10-CM | POA: Diagnosis not present

## 2022-02-21 DIAGNOSIS — Z008 Encounter for other general examination: Secondary | ICD-10-CM | POA: Diagnosis not present

## 2022-02-21 DIAGNOSIS — K219 Gastro-esophageal reflux disease without esophagitis: Secondary | ICD-10-CM | POA: Diagnosis not present

## 2022-02-21 DIAGNOSIS — R69 Illness, unspecified: Secondary | ICD-10-CM | POA: Diagnosis not present

## 2022-02-21 DIAGNOSIS — Z823 Family history of stroke: Secondary | ICD-10-CM | POA: Diagnosis not present

## 2022-02-21 DIAGNOSIS — M109 Gout, unspecified: Secondary | ICD-10-CM | POA: Diagnosis not present

## 2022-05-16 IMAGING — DX DG CHEST 2V
2 series · 2 of 2 positions shown · non-contrast
Comparison: None.

CLINICAL DATA: Acute chest pain and nose bleed.

EXAM:
CHEST - 2 VIEW

[chest pa]
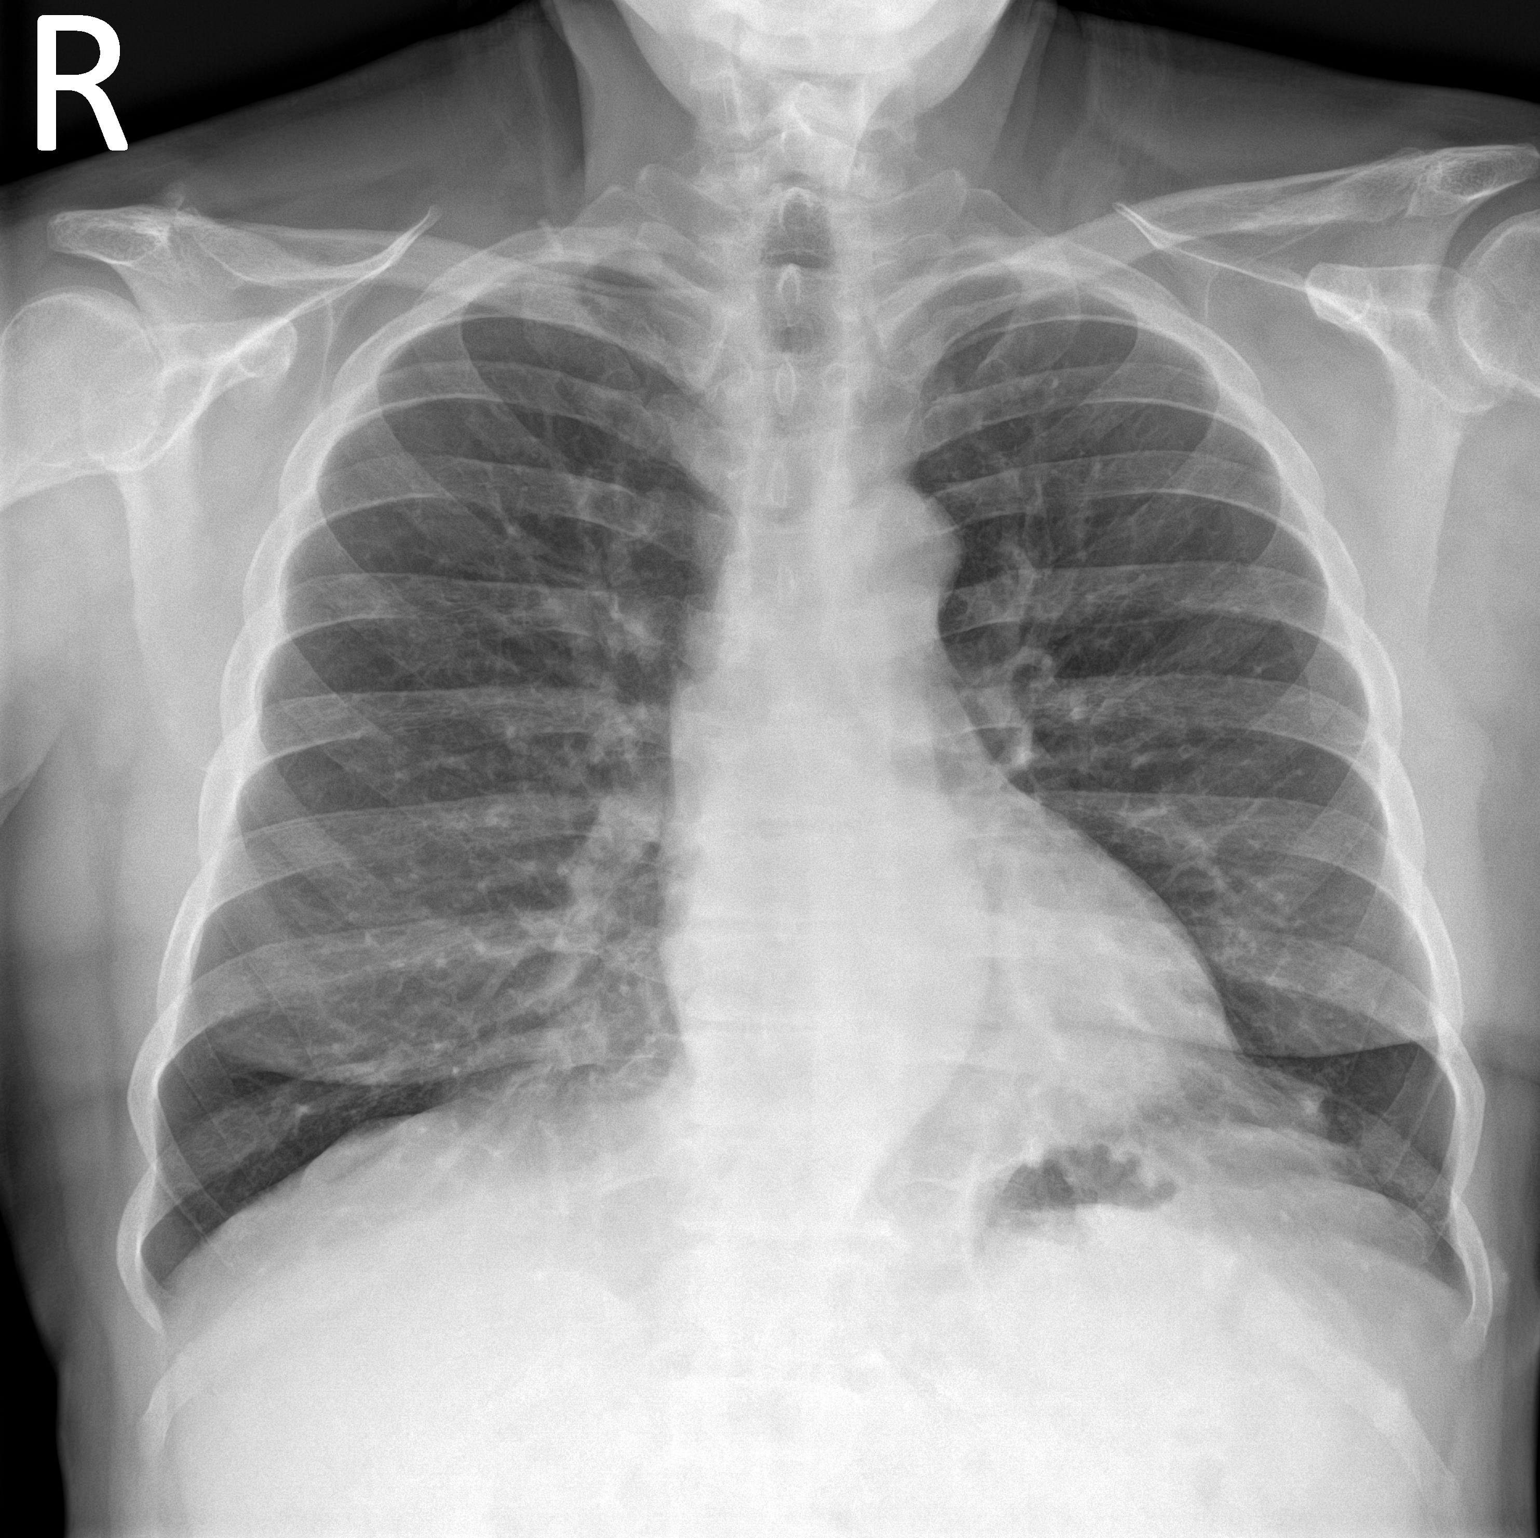

[chest lat]
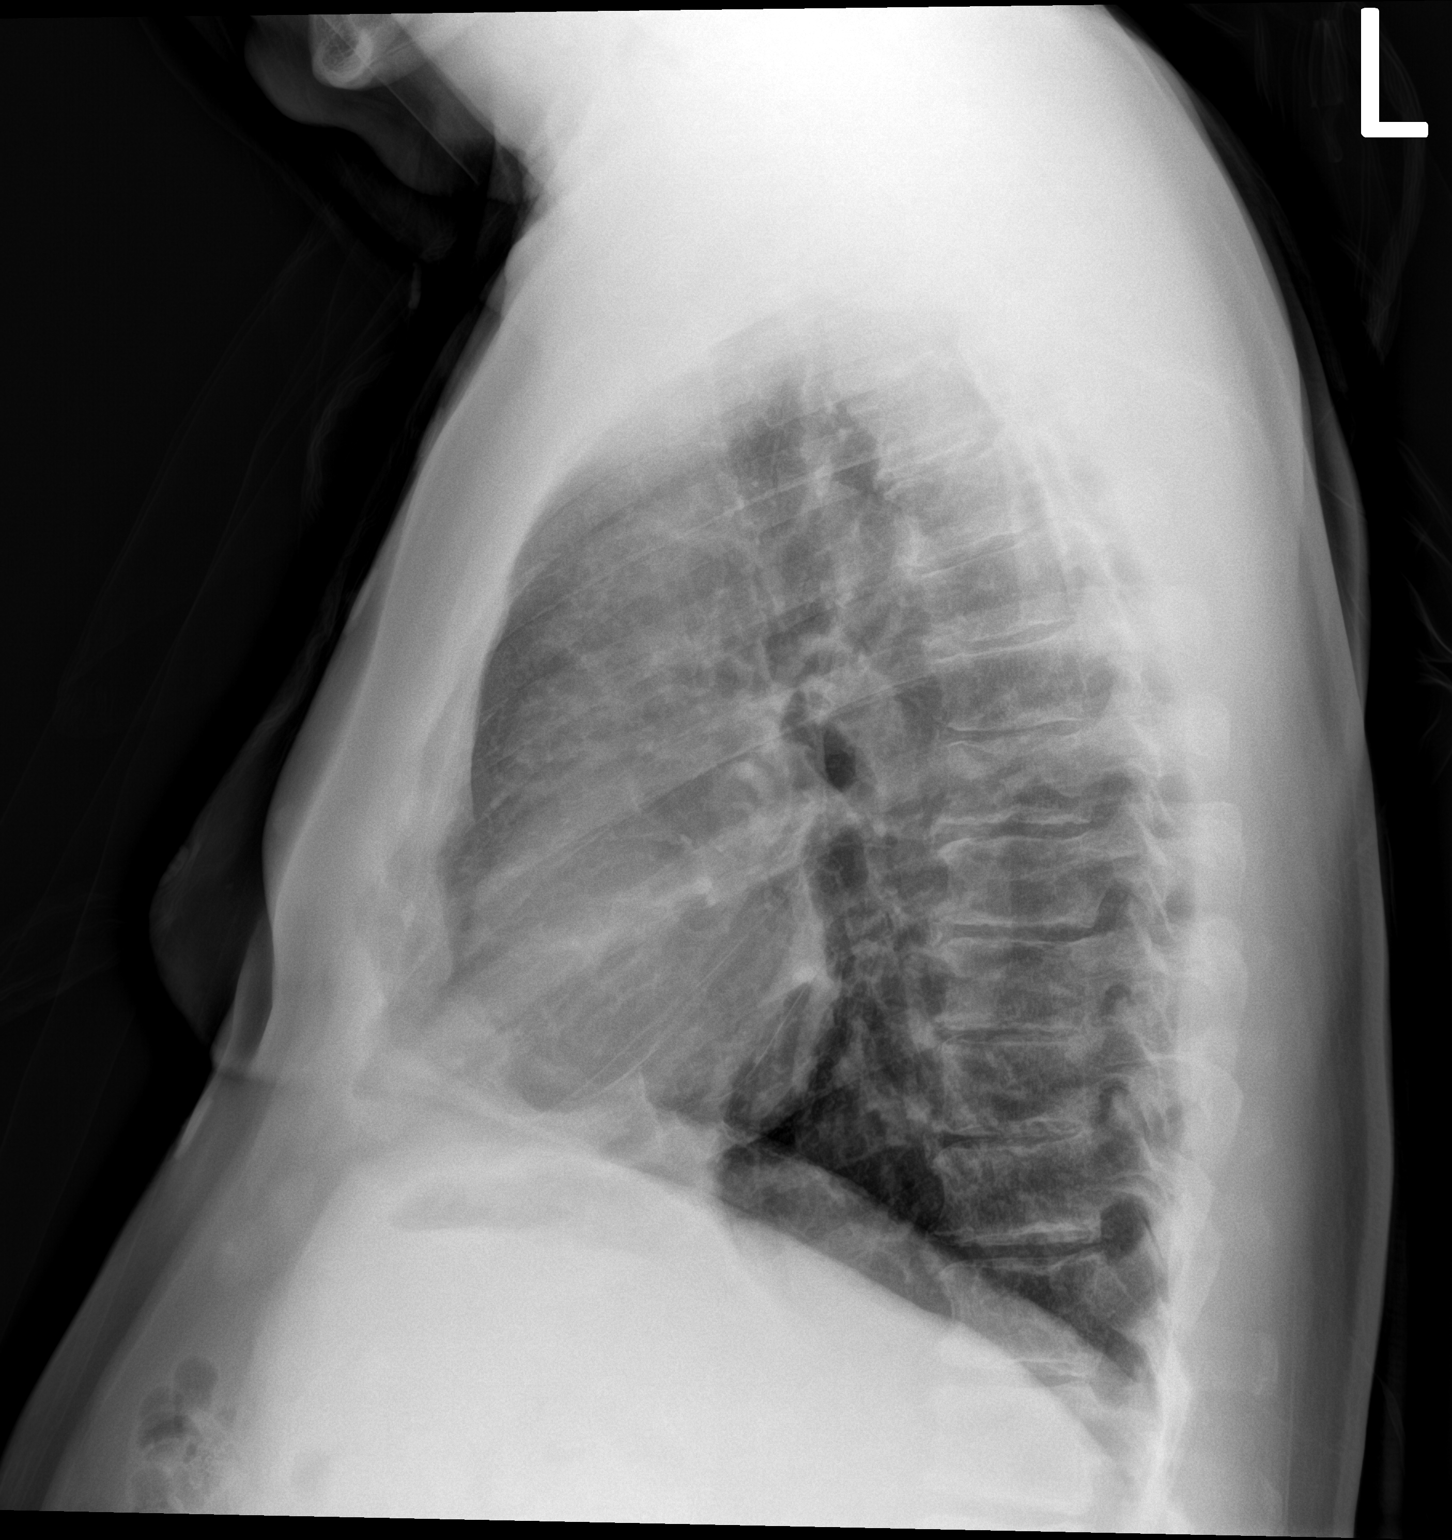

[2 of 2 positions shown; findings below may reference images not displayed]

FINDINGS: The cardiomediastinal silhouette is unremarkable.

There is no evidence of focal airspace disease, pulmonary edema,
suspicious pulmonary nodule/mass, pleural effusion, or pneumothorax.

No acute bony abnormalities are identified.
IMPRESSION: No active cardiopulmonary disease.

## 2022-09-21 DIAGNOSIS — M109 Gout, unspecified: Secondary | ICD-10-CM | POA: Diagnosis not present

## 2022-09-21 DIAGNOSIS — R7301 Impaired fasting glucose: Secondary | ICD-10-CM | POA: Diagnosis not present

## 2022-09-21 DIAGNOSIS — N529 Male erectile dysfunction, unspecified: Secondary | ICD-10-CM | POA: Diagnosis not present

## 2022-09-21 DIAGNOSIS — E7800 Pure hypercholesterolemia, unspecified: Secondary | ICD-10-CM | POA: Diagnosis not present

## 2022-09-21 DIAGNOSIS — Z125 Encounter for screening for malignant neoplasm of prostate: Secondary | ICD-10-CM | POA: Diagnosis not present

## 2022-09-21 DIAGNOSIS — R7989 Other specified abnormal findings of blood chemistry: Secondary | ICD-10-CM | POA: Diagnosis not present

## 2022-09-22 DIAGNOSIS — D100 Benign neoplasm of lip: Secondary | ICD-10-CM | POA: Diagnosis not present

## 2022-09-28 DIAGNOSIS — E7800 Pure hypercholesterolemia, unspecified: Secondary | ICD-10-CM | POA: Diagnosis not present

## 2022-09-28 DIAGNOSIS — H9193 Unspecified hearing loss, bilateral: Secondary | ICD-10-CM | POA: Diagnosis not present

## 2022-09-28 DIAGNOSIS — Z1331 Encounter for screening for depression: Secondary | ICD-10-CM | POA: Diagnosis not present

## 2022-09-28 DIAGNOSIS — M1990 Unspecified osteoarthritis, unspecified site: Secondary | ICD-10-CM | POA: Diagnosis not present

## 2022-09-28 DIAGNOSIS — Z85828 Personal history of other malignant neoplasm of skin: Secondary | ICD-10-CM | POA: Diagnosis not present

## 2022-09-28 DIAGNOSIS — F17200 Nicotine dependence, unspecified, uncomplicated: Secondary | ICD-10-CM | POA: Diagnosis not present

## 2022-09-28 DIAGNOSIS — G4733 Obstructive sleep apnea (adult) (pediatric): Secondary | ICD-10-CM | POA: Diagnosis not present

## 2022-09-28 DIAGNOSIS — R9431 Abnormal electrocardiogram [ECG] [EKG]: Secondary | ICD-10-CM | POA: Diagnosis not present

## 2022-09-28 DIAGNOSIS — M109 Gout, unspecified: Secondary | ICD-10-CM | POA: Diagnosis not present

## 2022-09-28 DIAGNOSIS — Z23 Encounter for immunization: Secondary | ICD-10-CM | POA: Diagnosis not present

## 2022-09-28 DIAGNOSIS — Z1339 Encounter for screening examination for other mental health and behavioral disorders: Secondary | ICD-10-CM | POA: Diagnosis not present

## 2022-09-28 DIAGNOSIS — Z0000 Encounter for general adult medical examination without abnormal findings: Secondary | ICD-10-CM | POA: Diagnosis not present

## 2022-09-28 DIAGNOSIS — R82998 Other abnormal findings in urine: Secondary | ICD-10-CM | POA: Diagnosis not present

## 2022-09-28 DIAGNOSIS — E663 Overweight: Secondary | ICD-10-CM | POA: Diagnosis not present

## 2022-09-28 DIAGNOSIS — R7301 Impaired fasting glucose: Secondary | ICD-10-CM | POA: Diagnosis not present

## 2023-01-19 DIAGNOSIS — H35373 Puckering of macula, bilateral: Secondary | ICD-10-CM | POA: Diagnosis not present

## 2023-01-19 DIAGNOSIS — H2513 Age-related nuclear cataract, bilateral: Secondary | ICD-10-CM | POA: Diagnosis not present

## 2023-01-19 DIAGNOSIS — H47323 Drusen of optic disc, bilateral: Secondary | ICD-10-CM | POA: Diagnosis not present

## 2023-01-24 DIAGNOSIS — Z7982 Long term (current) use of aspirin: Secondary | ICD-10-CM | POA: Diagnosis not present

## 2023-01-24 DIAGNOSIS — Z85820 Personal history of malignant melanoma of skin: Secondary | ICD-10-CM | POA: Diagnosis not present

## 2023-01-24 DIAGNOSIS — Z823 Family history of stroke: Secondary | ICD-10-CM | POA: Diagnosis not present

## 2023-01-24 DIAGNOSIS — E785 Hyperlipidemia, unspecified: Secondary | ICD-10-CM | POA: Diagnosis not present

## 2023-01-24 DIAGNOSIS — Z882 Allergy status to sulfonamides status: Secondary | ICD-10-CM | POA: Diagnosis not present

## 2023-01-24 DIAGNOSIS — M109 Gout, unspecified: Secondary | ICD-10-CM | POA: Diagnosis not present

## 2023-01-24 DIAGNOSIS — F17290 Nicotine dependence, other tobacco product, uncomplicated: Secondary | ICD-10-CM | POA: Diagnosis not present

## 2023-02-02 DIAGNOSIS — H47323 Drusen of optic disc, bilateral: Secondary | ICD-10-CM | POA: Diagnosis not present

## 2023-02-02 DIAGNOSIS — H25811 Combined forms of age-related cataract, right eye: Secondary | ICD-10-CM | POA: Diagnosis not present

## 2023-02-07 DIAGNOSIS — L821 Other seborrheic keratosis: Secondary | ICD-10-CM | POA: Diagnosis not present

## 2023-02-07 DIAGNOSIS — L820 Inflamed seborrheic keratosis: Secondary | ICD-10-CM | POA: Diagnosis not present

## 2023-02-07 DIAGNOSIS — D1801 Hemangioma of skin and subcutaneous tissue: Secondary | ICD-10-CM | POA: Diagnosis not present

## 2023-02-07 DIAGNOSIS — L570 Actinic keratosis: Secondary | ICD-10-CM | POA: Diagnosis not present

## 2023-02-07 DIAGNOSIS — Z85820 Personal history of malignant melanoma of skin: Secondary | ICD-10-CM | POA: Diagnosis not present

## 2023-02-07 DIAGNOSIS — D485 Neoplasm of uncertain behavior of skin: Secondary | ICD-10-CM | POA: Diagnosis not present

## 2023-02-07 DIAGNOSIS — D2272 Melanocytic nevi of left lower limb, including hip: Secondary | ICD-10-CM | POA: Diagnosis not present

## 2023-02-07 DIAGNOSIS — L738 Other specified follicular disorders: Secondary | ICD-10-CM | POA: Diagnosis not present

## 2023-02-24 DIAGNOSIS — H25811 Combined forms of age-related cataract, right eye: Secondary | ICD-10-CM | POA: Diagnosis not present

## 2023-03-08 DIAGNOSIS — H2513 Age-related nuclear cataract, bilateral: Secondary | ICD-10-CM | POA: Diagnosis not present

## 2023-03-08 DIAGNOSIS — H25812 Combined forms of age-related cataract, left eye: Secondary | ICD-10-CM | POA: Diagnosis not present

## 2023-04-06 DIAGNOSIS — Z0100 Encounter for examination of eyes and vision without abnormal findings: Secondary | ICD-10-CM | POA: Diagnosis not present

## 2023-05-17 ENCOUNTER — Encounter: Payer: Self-pay | Admitting: Gastroenterology

## 2023-09-25 DIAGNOSIS — Z125 Encounter for screening for malignant neoplasm of prostate: Secondary | ICD-10-CM | POA: Diagnosis not present

## 2023-09-25 DIAGNOSIS — M109 Gout, unspecified: Secondary | ICD-10-CM | POA: Diagnosis not present

## 2023-09-25 DIAGNOSIS — R7301 Impaired fasting glucose: Secondary | ICD-10-CM | POA: Diagnosis not present

## 2023-09-25 DIAGNOSIS — Z1212 Encounter for screening for malignant neoplasm of rectum: Secondary | ICD-10-CM | POA: Diagnosis not present

## 2023-09-25 DIAGNOSIS — E7800 Pure hypercholesterolemia, unspecified: Secondary | ICD-10-CM | POA: Diagnosis not present

## 2023-09-25 DIAGNOSIS — E781 Pure hyperglyceridemia: Secondary | ICD-10-CM | POA: Diagnosis not present

## 2023-10-02 DIAGNOSIS — Z1331 Encounter for screening for depression: Secondary | ICD-10-CM | POA: Diagnosis not present

## 2023-10-02 DIAGNOSIS — Z0000 Encounter for general adult medical examination without abnormal findings: Secondary | ICD-10-CM | POA: Diagnosis not present

## 2023-10-02 DIAGNOSIS — Z1339 Encounter for screening examination for other mental health and behavioral disorders: Secondary | ICD-10-CM | POA: Diagnosis not present

## 2023-10-02 DIAGNOSIS — D0321 Melanoma in situ of right ear and external auricular canal: Secondary | ICD-10-CM | POA: Diagnosis not present

## 2023-10-02 DIAGNOSIS — G4733 Obstructive sleep apnea (adult) (pediatric): Secondary | ICD-10-CM | POA: Diagnosis not present

## 2023-10-02 DIAGNOSIS — E781 Pure hyperglyceridemia: Secondary | ICD-10-CM | POA: Diagnosis not present

## 2023-10-02 DIAGNOSIS — R7301 Impaired fasting glucose: Secondary | ICD-10-CM | POA: Diagnosis not present

## 2023-10-02 DIAGNOSIS — Z1212 Encounter for screening for malignant neoplasm of rectum: Secondary | ICD-10-CM | POA: Diagnosis not present

## 2023-10-02 DIAGNOSIS — E663 Overweight: Secondary | ICD-10-CM | POA: Diagnosis not present

## 2023-10-02 DIAGNOSIS — M109 Gout, unspecified: Secondary | ICD-10-CM | POA: Diagnosis not present

## 2023-10-02 DIAGNOSIS — F17200 Nicotine dependence, unspecified, uncomplicated: Secondary | ICD-10-CM | POA: Diagnosis not present

## 2023-10-02 DIAGNOSIS — N529 Male erectile dysfunction, unspecified: Secondary | ICD-10-CM | POA: Diagnosis not present

## 2023-10-02 DIAGNOSIS — H9193 Unspecified hearing loss, bilateral: Secondary | ICD-10-CM | POA: Diagnosis not present

## 2023-10-02 DIAGNOSIS — E7800 Pure hypercholesterolemia, unspecified: Secondary | ICD-10-CM | POA: Diagnosis not present

## 2023-10-02 DIAGNOSIS — R82998 Other abnormal findings in urine: Secondary | ICD-10-CM | POA: Diagnosis not present

## 2023-10-09 DIAGNOSIS — S39012A Strain of muscle, fascia and tendon of lower back, initial encounter: Secondary | ICD-10-CM | POA: Diagnosis not present

## 2023-10-09 DIAGNOSIS — S86811A Strain of other muscle(s) and tendon(s) at lower leg level, right leg, initial encounter: Secondary | ICD-10-CM | POA: Diagnosis not present

## 2023-10-09 DIAGNOSIS — M25559 Pain in unspecified hip: Secondary | ICD-10-CM | POA: Diagnosis not present

## 2023-10-09 DIAGNOSIS — S76819S Strain of other specified muscles, fascia and tendons at thigh level, unspecified thigh, sequela: Secondary | ICD-10-CM | POA: Diagnosis not present

## 2023-10-11 DIAGNOSIS — S76819S Strain of other specified muscles, fascia and tendons at thigh level, unspecified thigh, sequela: Secondary | ICD-10-CM | POA: Diagnosis not present

## 2023-10-11 DIAGNOSIS — S39012A Strain of muscle, fascia and tendon of lower back, initial encounter: Secondary | ICD-10-CM | POA: Diagnosis not present

## 2023-10-11 DIAGNOSIS — S86811A Strain of other muscle(s) and tendon(s) at lower leg level, right leg, initial encounter: Secondary | ICD-10-CM | POA: Diagnosis not present

## 2023-10-11 DIAGNOSIS — M25559 Pain in unspecified hip: Secondary | ICD-10-CM | POA: Diagnosis not present

## 2023-10-16 DIAGNOSIS — S39012A Strain of muscle, fascia and tendon of lower back, initial encounter: Secondary | ICD-10-CM | POA: Diagnosis not present

## 2023-10-16 DIAGNOSIS — M25559 Pain in unspecified hip: Secondary | ICD-10-CM | POA: Diagnosis not present

## 2023-10-16 DIAGNOSIS — S86811A Strain of other muscle(s) and tendon(s) at lower leg level, right leg, initial encounter: Secondary | ICD-10-CM | POA: Diagnosis not present

## 2023-10-16 DIAGNOSIS — S76819S Strain of other specified muscles, fascia and tendons at thigh level, unspecified thigh, sequela: Secondary | ICD-10-CM | POA: Diagnosis not present

## 2023-10-18 DIAGNOSIS — S76819S Strain of other specified muscles, fascia and tendons at thigh level, unspecified thigh, sequela: Secondary | ICD-10-CM | POA: Diagnosis not present

## 2023-10-18 DIAGNOSIS — M25559 Pain in unspecified hip: Secondary | ICD-10-CM | POA: Diagnosis not present

## 2023-10-18 DIAGNOSIS — S39012A Strain of muscle, fascia and tendon of lower back, initial encounter: Secondary | ICD-10-CM | POA: Diagnosis not present

## 2023-10-18 DIAGNOSIS — S86811A Strain of other muscle(s) and tendon(s) at lower leg level, right leg, initial encounter: Secondary | ICD-10-CM | POA: Diagnosis not present

## 2023-10-23 DIAGNOSIS — S39012A Strain of muscle, fascia and tendon of lower back, initial encounter: Secondary | ICD-10-CM | POA: Diagnosis not present

## 2023-10-23 DIAGNOSIS — S76819S Strain of other specified muscles, fascia and tendons at thigh level, unspecified thigh, sequela: Secondary | ICD-10-CM | POA: Diagnosis not present

## 2023-10-23 DIAGNOSIS — S86811A Strain of other muscle(s) and tendon(s) at lower leg level, right leg, initial encounter: Secondary | ICD-10-CM | POA: Diagnosis not present

## 2023-10-23 DIAGNOSIS — M25559 Pain in unspecified hip: Secondary | ICD-10-CM | POA: Diagnosis not present

## 2023-10-25 DIAGNOSIS — S76819S Strain of other specified muscles, fascia and tendons at thigh level, unspecified thigh, sequela: Secondary | ICD-10-CM | POA: Diagnosis not present

## 2023-10-25 DIAGNOSIS — M25559 Pain in unspecified hip: Secondary | ICD-10-CM | POA: Diagnosis not present

## 2023-10-25 DIAGNOSIS — S86811A Strain of other muscle(s) and tendon(s) at lower leg level, right leg, initial encounter: Secondary | ICD-10-CM | POA: Diagnosis not present

## 2023-10-25 DIAGNOSIS — S39012A Strain of muscle, fascia and tendon of lower back, initial encounter: Secondary | ICD-10-CM | POA: Diagnosis not present

## 2023-10-30 DIAGNOSIS — S39012A Strain of muscle, fascia and tendon of lower back, initial encounter: Secondary | ICD-10-CM | POA: Diagnosis not present

## 2023-10-30 DIAGNOSIS — S86811A Strain of other muscle(s) and tendon(s) at lower leg level, right leg, initial encounter: Secondary | ICD-10-CM | POA: Diagnosis not present

## 2023-10-30 DIAGNOSIS — M25559 Pain in unspecified hip: Secondary | ICD-10-CM | POA: Diagnosis not present

## 2023-10-30 DIAGNOSIS — S76819S Strain of other specified muscles, fascia and tendons at thigh level, unspecified thigh, sequela: Secondary | ICD-10-CM | POA: Diagnosis not present

## 2023-11-01 DIAGNOSIS — S86811A Strain of other muscle(s) and tendon(s) at lower leg level, right leg, initial encounter: Secondary | ICD-10-CM | POA: Diagnosis not present

## 2023-11-01 DIAGNOSIS — S76819S Strain of other specified muscles, fascia and tendons at thigh level, unspecified thigh, sequela: Secondary | ICD-10-CM | POA: Diagnosis not present

## 2023-11-01 DIAGNOSIS — S39012A Strain of muscle, fascia and tendon of lower back, initial encounter: Secondary | ICD-10-CM | POA: Diagnosis not present

## 2023-11-01 DIAGNOSIS — M25559 Pain in unspecified hip: Secondary | ICD-10-CM | POA: Diagnosis not present

## 2023-11-06 DIAGNOSIS — M25559 Pain in unspecified hip: Secondary | ICD-10-CM | POA: Diagnosis not present

## 2023-11-06 DIAGNOSIS — S76819S Strain of other specified muscles, fascia and tendons at thigh level, unspecified thigh, sequela: Secondary | ICD-10-CM | POA: Diagnosis not present

## 2023-11-06 DIAGNOSIS — S39012A Strain of muscle, fascia and tendon of lower back, initial encounter: Secondary | ICD-10-CM | POA: Diagnosis not present

## 2023-11-06 DIAGNOSIS — S86811A Strain of other muscle(s) and tendon(s) at lower leg level, right leg, initial encounter: Secondary | ICD-10-CM | POA: Diagnosis not present

## 2023-11-08 DIAGNOSIS — M25559 Pain in unspecified hip: Secondary | ICD-10-CM | POA: Diagnosis not present

## 2023-11-08 DIAGNOSIS — S86811A Strain of other muscle(s) and tendon(s) at lower leg level, right leg, initial encounter: Secondary | ICD-10-CM | POA: Diagnosis not present

## 2023-11-08 DIAGNOSIS — S39012A Strain of muscle, fascia and tendon of lower back, initial encounter: Secondary | ICD-10-CM | POA: Diagnosis not present

## 2023-11-08 DIAGNOSIS — S76819S Strain of other specified muscles, fascia and tendons at thigh level, unspecified thigh, sequela: Secondary | ICD-10-CM | POA: Diagnosis not present

## 2023-11-13 DIAGNOSIS — S76819S Strain of other specified muscles, fascia and tendons at thigh level, unspecified thigh, sequela: Secondary | ICD-10-CM | POA: Diagnosis not present

## 2023-11-13 DIAGNOSIS — M25559 Pain in unspecified hip: Secondary | ICD-10-CM | POA: Diagnosis not present

## 2023-11-13 DIAGNOSIS — S39012A Strain of muscle, fascia and tendon of lower back, initial encounter: Secondary | ICD-10-CM | POA: Diagnosis not present

## 2023-11-13 DIAGNOSIS — S86811A Strain of other muscle(s) and tendon(s) at lower leg level, right leg, initial encounter: Secondary | ICD-10-CM | POA: Diagnosis not present

## 2023-11-15 DIAGNOSIS — S39012A Strain of muscle, fascia and tendon of lower back, initial encounter: Secondary | ICD-10-CM | POA: Diagnosis not present

## 2023-11-15 DIAGNOSIS — M25559 Pain in unspecified hip: Secondary | ICD-10-CM | POA: Diagnosis not present

## 2023-11-15 DIAGNOSIS — S76819S Strain of other specified muscles, fascia and tendons at thigh level, unspecified thigh, sequela: Secondary | ICD-10-CM | POA: Diagnosis not present

## 2023-11-15 DIAGNOSIS — S86811A Strain of other muscle(s) and tendon(s) at lower leg level, right leg, initial encounter: Secondary | ICD-10-CM | POA: Diagnosis not present

## 2023-11-20 DIAGNOSIS — S39012A Strain of muscle, fascia and tendon of lower back, initial encounter: Secondary | ICD-10-CM | POA: Diagnosis not present

## 2023-11-20 DIAGNOSIS — S76819S Strain of other specified muscles, fascia and tendons at thigh level, unspecified thigh, sequela: Secondary | ICD-10-CM | POA: Diagnosis not present

## 2023-11-20 DIAGNOSIS — S86811A Strain of other muscle(s) and tendon(s) at lower leg level, right leg, initial encounter: Secondary | ICD-10-CM | POA: Diagnosis not present

## 2023-11-20 DIAGNOSIS — M25559 Pain in unspecified hip: Secondary | ICD-10-CM | POA: Diagnosis not present

## 2023-11-22 DIAGNOSIS — S76819S Strain of other specified muscles, fascia and tendons at thigh level, unspecified thigh, sequela: Secondary | ICD-10-CM | POA: Diagnosis not present

## 2023-11-22 DIAGNOSIS — S39012A Strain of muscle, fascia and tendon of lower back, initial encounter: Secondary | ICD-10-CM | POA: Diagnosis not present

## 2023-11-22 DIAGNOSIS — M25559 Pain in unspecified hip: Secondary | ICD-10-CM | POA: Diagnosis not present

## 2023-11-22 DIAGNOSIS — S86811A Strain of other muscle(s) and tendon(s) at lower leg level, right leg, initial encounter: Secondary | ICD-10-CM | POA: Diagnosis not present

## 2023-11-29 DIAGNOSIS — M25559 Pain in unspecified hip: Secondary | ICD-10-CM | POA: Diagnosis not present

## 2023-11-29 DIAGNOSIS — S39012A Strain of muscle, fascia and tendon of lower back, initial encounter: Secondary | ICD-10-CM | POA: Diagnosis not present

## 2023-11-29 DIAGNOSIS — S86811A Strain of other muscle(s) and tendon(s) at lower leg level, right leg, initial encounter: Secondary | ICD-10-CM | POA: Diagnosis not present

## 2023-11-29 DIAGNOSIS — S76819S Strain of other specified muscles, fascia and tendons at thigh level, unspecified thigh, sequela: Secondary | ICD-10-CM | POA: Diagnosis not present

## 2023-12-01 DIAGNOSIS — S39012A Strain of muscle, fascia and tendon of lower back, initial encounter: Secondary | ICD-10-CM | POA: Diagnosis not present

## 2023-12-01 DIAGNOSIS — M25559 Pain in unspecified hip: Secondary | ICD-10-CM | POA: Diagnosis not present

## 2023-12-01 DIAGNOSIS — S76819S Strain of other specified muscles, fascia and tendons at thigh level, unspecified thigh, sequela: Secondary | ICD-10-CM | POA: Diagnosis not present

## 2023-12-01 DIAGNOSIS — S86811A Strain of other muscle(s) and tendon(s) at lower leg level, right leg, initial encounter: Secondary | ICD-10-CM | POA: Diagnosis not present

## 2023-12-04 DIAGNOSIS — S86811A Strain of other muscle(s) and tendon(s) at lower leg level, right leg, initial encounter: Secondary | ICD-10-CM | POA: Diagnosis not present

## 2023-12-04 DIAGNOSIS — M25559 Pain in unspecified hip: Secondary | ICD-10-CM | POA: Diagnosis not present

## 2023-12-04 DIAGNOSIS — S76819S Strain of other specified muscles, fascia and tendons at thigh level, unspecified thigh, sequela: Secondary | ICD-10-CM | POA: Diagnosis not present

## 2023-12-04 DIAGNOSIS — S39012A Strain of muscle, fascia and tendon of lower back, initial encounter: Secondary | ICD-10-CM | POA: Diagnosis not present

## 2023-12-06 DIAGNOSIS — M25559 Pain in unspecified hip: Secondary | ICD-10-CM | POA: Diagnosis not present

## 2023-12-06 DIAGNOSIS — S76819S Strain of other specified muscles, fascia and tendons at thigh level, unspecified thigh, sequela: Secondary | ICD-10-CM | POA: Diagnosis not present

## 2023-12-06 DIAGNOSIS — S39012A Strain of muscle, fascia and tendon of lower back, initial encounter: Secondary | ICD-10-CM | POA: Diagnosis not present

## 2023-12-06 DIAGNOSIS — S86811A Strain of other muscle(s) and tendon(s) at lower leg level, right leg, initial encounter: Secondary | ICD-10-CM | POA: Diagnosis not present

## 2023-12-11 DIAGNOSIS — M25559 Pain in unspecified hip: Secondary | ICD-10-CM | POA: Diagnosis not present

## 2023-12-11 DIAGNOSIS — S76819S Strain of other specified muscles, fascia and tendons at thigh level, unspecified thigh, sequela: Secondary | ICD-10-CM | POA: Diagnosis not present

## 2023-12-11 DIAGNOSIS — S39012A Strain of muscle, fascia and tendon of lower back, initial encounter: Secondary | ICD-10-CM | POA: Diagnosis not present

## 2023-12-11 DIAGNOSIS — S86811A Strain of other muscle(s) and tendon(s) at lower leg level, right leg, initial encounter: Secondary | ICD-10-CM | POA: Diagnosis not present

## 2023-12-13 DIAGNOSIS — S86811A Strain of other muscle(s) and tendon(s) at lower leg level, right leg, initial encounter: Secondary | ICD-10-CM | POA: Diagnosis not present

## 2023-12-13 DIAGNOSIS — S39012A Strain of muscle, fascia and tendon of lower back, initial encounter: Secondary | ICD-10-CM | POA: Diagnosis not present

## 2023-12-13 DIAGNOSIS — S76819S Strain of other specified muscles, fascia and tendons at thigh level, unspecified thigh, sequela: Secondary | ICD-10-CM | POA: Diagnosis not present

## 2023-12-13 DIAGNOSIS — M25559 Pain in unspecified hip: Secondary | ICD-10-CM | POA: Diagnosis not present

## 2023-12-18 DIAGNOSIS — S39012A Strain of muscle, fascia and tendon of lower back, initial encounter: Secondary | ICD-10-CM | POA: Diagnosis not present

## 2023-12-18 DIAGNOSIS — M25559 Pain in unspecified hip: Secondary | ICD-10-CM | POA: Diagnosis not present

## 2023-12-18 DIAGNOSIS — S76819S Strain of other specified muscles, fascia and tendons at thigh level, unspecified thigh, sequela: Secondary | ICD-10-CM | POA: Diagnosis not present

## 2023-12-18 DIAGNOSIS — S86811A Strain of other muscle(s) and tendon(s) at lower leg level, right leg, initial encounter: Secondary | ICD-10-CM | POA: Diagnosis not present

## 2023-12-20 DIAGNOSIS — S86811A Strain of other muscle(s) and tendon(s) at lower leg level, right leg, initial encounter: Secondary | ICD-10-CM | POA: Diagnosis not present

## 2023-12-20 DIAGNOSIS — M25559 Pain in unspecified hip: Secondary | ICD-10-CM | POA: Diagnosis not present

## 2023-12-20 DIAGNOSIS — S76819S Strain of other specified muscles, fascia and tendons at thigh level, unspecified thigh, sequela: Secondary | ICD-10-CM | POA: Diagnosis not present

## 2023-12-20 DIAGNOSIS — S39012A Strain of muscle, fascia and tendon of lower back, initial encounter: Secondary | ICD-10-CM | POA: Diagnosis not present

## 2023-12-25 DIAGNOSIS — S76819S Strain of other specified muscles, fascia and tendons at thigh level, unspecified thigh, sequela: Secondary | ICD-10-CM | POA: Diagnosis not present

## 2023-12-25 DIAGNOSIS — S86811A Strain of other muscle(s) and tendon(s) at lower leg level, right leg, initial encounter: Secondary | ICD-10-CM | POA: Diagnosis not present

## 2023-12-25 DIAGNOSIS — M25559 Pain in unspecified hip: Secondary | ICD-10-CM | POA: Diagnosis not present

## 2023-12-25 DIAGNOSIS — S39012A Strain of muscle, fascia and tendon of lower back, initial encounter: Secondary | ICD-10-CM | POA: Diagnosis not present

## 2023-12-27 DIAGNOSIS — S86811A Strain of other muscle(s) and tendon(s) at lower leg level, right leg, initial encounter: Secondary | ICD-10-CM | POA: Diagnosis not present

## 2023-12-27 DIAGNOSIS — S39012A Strain of muscle, fascia and tendon of lower back, initial encounter: Secondary | ICD-10-CM | POA: Diagnosis not present

## 2023-12-27 DIAGNOSIS — S76819S Strain of other specified muscles, fascia and tendons at thigh level, unspecified thigh, sequela: Secondary | ICD-10-CM | POA: Diagnosis not present

## 2023-12-27 DIAGNOSIS — M25559 Pain in unspecified hip: Secondary | ICD-10-CM | POA: Diagnosis not present

## 2024-01-01 DIAGNOSIS — S39012A Strain of muscle, fascia and tendon of lower back, initial encounter: Secondary | ICD-10-CM | POA: Diagnosis not present

## 2024-01-01 DIAGNOSIS — S86811A Strain of other muscle(s) and tendon(s) at lower leg level, right leg, initial encounter: Secondary | ICD-10-CM | POA: Diagnosis not present

## 2024-01-01 DIAGNOSIS — M25559 Pain in unspecified hip: Secondary | ICD-10-CM | POA: Diagnosis not present

## 2024-01-01 DIAGNOSIS — S76819S Strain of other specified muscles, fascia and tendons at thigh level, unspecified thigh, sequela: Secondary | ICD-10-CM | POA: Diagnosis not present

## 2024-01-03 DIAGNOSIS — S86811A Strain of other muscle(s) and tendon(s) at lower leg level, right leg, initial encounter: Secondary | ICD-10-CM | POA: Diagnosis not present

## 2024-01-03 DIAGNOSIS — M25559 Pain in unspecified hip: Secondary | ICD-10-CM | POA: Diagnosis not present

## 2024-01-03 DIAGNOSIS — S39012A Strain of muscle, fascia and tendon of lower back, initial encounter: Secondary | ICD-10-CM | POA: Diagnosis not present

## 2024-01-03 DIAGNOSIS — S76819S Strain of other specified muscles, fascia and tendons at thigh level, unspecified thigh, sequela: Secondary | ICD-10-CM | POA: Diagnosis not present

## 2024-01-08 DIAGNOSIS — S39012A Strain of muscle, fascia and tendon of lower back, initial encounter: Secondary | ICD-10-CM | POA: Diagnosis not present

## 2024-01-08 DIAGNOSIS — S76819S Strain of other specified muscles, fascia and tendons at thigh level, unspecified thigh, sequela: Secondary | ICD-10-CM | POA: Diagnosis not present

## 2024-01-08 DIAGNOSIS — M25559 Pain in unspecified hip: Secondary | ICD-10-CM | POA: Diagnosis not present

## 2024-01-08 DIAGNOSIS — S86811A Strain of other muscle(s) and tendon(s) at lower leg level, right leg, initial encounter: Secondary | ICD-10-CM | POA: Diagnosis not present

## 2024-01-10 DIAGNOSIS — M25559 Pain in unspecified hip: Secondary | ICD-10-CM | POA: Diagnosis not present

## 2024-01-10 DIAGNOSIS — S76819S Strain of other specified muscles, fascia and tendons at thigh level, unspecified thigh, sequela: Secondary | ICD-10-CM | POA: Diagnosis not present

## 2024-01-10 DIAGNOSIS — S39012A Strain of muscle, fascia and tendon of lower back, initial encounter: Secondary | ICD-10-CM | POA: Diagnosis not present

## 2024-01-10 DIAGNOSIS — S86811A Strain of other muscle(s) and tendon(s) at lower leg level, right leg, initial encounter: Secondary | ICD-10-CM | POA: Diagnosis not present

## 2024-01-15 DIAGNOSIS — M25559 Pain in unspecified hip: Secondary | ICD-10-CM | POA: Diagnosis not present

## 2024-01-15 DIAGNOSIS — S86811A Strain of other muscle(s) and tendon(s) at lower leg level, right leg, initial encounter: Secondary | ICD-10-CM | POA: Diagnosis not present

## 2024-01-15 DIAGNOSIS — S76819S Strain of other specified muscles, fascia and tendons at thigh level, unspecified thigh, sequela: Secondary | ICD-10-CM | POA: Diagnosis not present

## 2024-01-15 DIAGNOSIS — S39012A Strain of muscle, fascia and tendon of lower back, initial encounter: Secondary | ICD-10-CM | POA: Diagnosis not present

## 2024-01-17 DIAGNOSIS — S86811A Strain of other muscle(s) and tendon(s) at lower leg level, right leg, initial encounter: Secondary | ICD-10-CM | POA: Diagnosis not present

## 2024-01-17 DIAGNOSIS — E785 Hyperlipidemia, unspecified: Secondary | ICD-10-CM | POA: Diagnosis not present

## 2024-01-17 DIAGNOSIS — R030 Elevated blood-pressure reading, without diagnosis of hypertension: Secondary | ICD-10-CM | POA: Diagnosis not present

## 2024-01-17 DIAGNOSIS — Z882 Allergy status to sulfonamides status: Secondary | ICD-10-CM | POA: Diagnosis not present

## 2024-01-17 DIAGNOSIS — M25559 Pain in unspecified hip: Secondary | ICD-10-CM | POA: Diagnosis not present

## 2024-01-17 DIAGNOSIS — Z7982 Long term (current) use of aspirin: Secondary | ICD-10-CM | POA: Diagnosis not present

## 2024-01-17 DIAGNOSIS — N529 Male erectile dysfunction, unspecified: Secondary | ICD-10-CM | POA: Diagnosis not present

## 2024-01-17 DIAGNOSIS — Z8249 Family history of ischemic heart disease and other diseases of the circulatory system: Secondary | ICD-10-CM | POA: Diagnosis not present

## 2024-01-17 DIAGNOSIS — Z823 Family history of stroke: Secondary | ICD-10-CM | POA: Diagnosis not present

## 2024-01-17 DIAGNOSIS — M109 Gout, unspecified: Secondary | ICD-10-CM | POA: Diagnosis not present

## 2024-01-17 DIAGNOSIS — S76819S Strain of other specified muscles, fascia and tendons at thigh level, unspecified thigh, sequela: Secondary | ICD-10-CM | POA: Diagnosis not present

## 2024-01-17 DIAGNOSIS — S39012A Strain of muscle, fascia and tendon of lower back, initial encounter: Secondary | ICD-10-CM | POA: Diagnosis not present

## 2024-01-17 DIAGNOSIS — F17210 Nicotine dependence, cigarettes, uncomplicated: Secondary | ICD-10-CM | POA: Diagnosis not present

## 2024-01-22 DIAGNOSIS — S86811A Strain of other muscle(s) and tendon(s) at lower leg level, right leg, initial encounter: Secondary | ICD-10-CM | POA: Diagnosis not present

## 2024-01-22 DIAGNOSIS — M25559 Pain in unspecified hip: Secondary | ICD-10-CM | POA: Diagnosis not present

## 2024-01-22 DIAGNOSIS — S39012A Strain of muscle, fascia and tendon of lower back, initial encounter: Secondary | ICD-10-CM | POA: Diagnosis not present

## 2024-01-22 DIAGNOSIS — S76819S Strain of other specified muscles, fascia and tendons at thigh level, unspecified thigh, sequela: Secondary | ICD-10-CM | POA: Diagnosis not present

## 2024-01-24 DIAGNOSIS — S76819S Strain of other specified muscles, fascia and tendons at thigh level, unspecified thigh, sequela: Secondary | ICD-10-CM | POA: Diagnosis not present

## 2024-01-24 DIAGNOSIS — M25559 Pain in unspecified hip: Secondary | ICD-10-CM | POA: Diagnosis not present

## 2024-01-24 DIAGNOSIS — S39012A Strain of muscle, fascia and tendon of lower back, initial encounter: Secondary | ICD-10-CM | POA: Diagnosis not present

## 2024-01-24 DIAGNOSIS — S86811A Strain of other muscle(s) and tendon(s) at lower leg level, right leg, initial encounter: Secondary | ICD-10-CM | POA: Diagnosis not present

## 2024-01-29 DIAGNOSIS — S76819S Strain of other specified muscles, fascia and tendons at thigh level, unspecified thigh, sequela: Secondary | ICD-10-CM | POA: Diagnosis not present

## 2024-01-29 DIAGNOSIS — S39012A Strain of muscle, fascia and tendon of lower back, initial encounter: Secondary | ICD-10-CM | POA: Diagnosis not present

## 2024-01-29 DIAGNOSIS — S86811A Strain of other muscle(s) and tendon(s) at lower leg level, right leg, initial encounter: Secondary | ICD-10-CM | POA: Diagnosis not present

## 2024-01-29 DIAGNOSIS — M25559 Pain in unspecified hip: Secondary | ICD-10-CM | POA: Diagnosis not present

## 2024-01-31 DIAGNOSIS — S39012A Strain of muscle, fascia and tendon of lower back, initial encounter: Secondary | ICD-10-CM | POA: Diagnosis not present

## 2024-01-31 DIAGNOSIS — S76819S Strain of other specified muscles, fascia and tendons at thigh level, unspecified thigh, sequela: Secondary | ICD-10-CM | POA: Diagnosis not present

## 2024-01-31 DIAGNOSIS — S86811A Strain of other muscle(s) and tendon(s) at lower leg level, right leg, initial encounter: Secondary | ICD-10-CM | POA: Diagnosis not present

## 2024-01-31 DIAGNOSIS — M25559 Pain in unspecified hip: Secondary | ICD-10-CM | POA: Diagnosis not present

## 2024-02-05 DIAGNOSIS — M25559 Pain in unspecified hip: Secondary | ICD-10-CM | POA: Diagnosis not present

## 2024-02-05 DIAGNOSIS — S39012A Strain of muscle, fascia and tendon of lower back, initial encounter: Secondary | ICD-10-CM | POA: Diagnosis not present

## 2024-02-05 DIAGNOSIS — S86811A Strain of other muscle(s) and tendon(s) at lower leg level, right leg, initial encounter: Secondary | ICD-10-CM | POA: Diagnosis not present

## 2024-02-05 DIAGNOSIS — S76819S Strain of other specified muscles, fascia and tendons at thigh level, unspecified thigh, sequela: Secondary | ICD-10-CM | POA: Diagnosis not present

## 2024-02-07 DIAGNOSIS — S39012A Strain of muscle, fascia and tendon of lower back, initial encounter: Secondary | ICD-10-CM | POA: Diagnosis not present

## 2024-02-07 DIAGNOSIS — S76819S Strain of other specified muscles, fascia and tendons at thigh level, unspecified thigh, sequela: Secondary | ICD-10-CM | POA: Diagnosis not present

## 2024-02-07 DIAGNOSIS — M25559 Pain in unspecified hip: Secondary | ICD-10-CM | POA: Diagnosis not present

## 2024-02-07 DIAGNOSIS — S86811A Strain of other muscle(s) and tendon(s) at lower leg level, right leg, initial encounter: Secondary | ICD-10-CM | POA: Diagnosis not present

## 2024-02-12 DIAGNOSIS — M25559 Pain in unspecified hip: Secondary | ICD-10-CM | POA: Diagnosis not present

## 2024-02-12 DIAGNOSIS — S39012A Strain of muscle, fascia and tendon of lower back, initial encounter: Secondary | ICD-10-CM | POA: Diagnosis not present

## 2024-02-12 DIAGNOSIS — S76819S Strain of other specified muscles, fascia and tendons at thigh level, unspecified thigh, sequela: Secondary | ICD-10-CM | POA: Diagnosis not present

## 2024-02-12 DIAGNOSIS — S86811A Strain of other muscle(s) and tendon(s) at lower leg level, right leg, initial encounter: Secondary | ICD-10-CM | POA: Diagnosis not present

## 2024-02-14 DIAGNOSIS — M25559 Pain in unspecified hip: Secondary | ICD-10-CM | POA: Diagnosis not present

## 2024-02-14 DIAGNOSIS — S76819S Strain of other specified muscles, fascia and tendons at thigh level, unspecified thigh, sequela: Secondary | ICD-10-CM | POA: Diagnosis not present

## 2024-02-14 DIAGNOSIS — S39012A Strain of muscle, fascia and tendon of lower back, initial encounter: Secondary | ICD-10-CM | POA: Diagnosis not present

## 2024-02-14 DIAGNOSIS — S86811A Strain of other muscle(s) and tendon(s) at lower leg level, right leg, initial encounter: Secondary | ICD-10-CM | POA: Diagnosis not present

## 2024-02-19 DIAGNOSIS — S86811A Strain of other muscle(s) and tendon(s) at lower leg level, right leg, initial encounter: Secondary | ICD-10-CM | POA: Diagnosis not present

## 2024-02-19 DIAGNOSIS — S76819S Strain of other specified muscles, fascia and tendons at thigh level, unspecified thigh, sequela: Secondary | ICD-10-CM | POA: Diagnosis not present

## 2024-02-19 DIAGNOSIS — M25559 Pain in unspecified hip: Secondary | ICD-10-CM | POA: Diagnosis not present

## 2024-02-19 DIAGNOSIS — S39012A Strain of muscle, fascia and tendon of lower back, initial encounter: Secondary | ICD-10-CM | POA: Diagnosis not present

## 2024-02-21 DIAGNOSIS — S76819S Strain of other specified muscles, fascia and tendons at thigh level, unspecified thigh, sequela: Secondary | ICD-10-CM | POA: Diagnosis not present

## 2024-02-21 DIAGNOSIS — S39012A Strain of muscle, fascia and tendon of lower back, initial encounter: Secondary | ICD-10-CM | POA: Diagnosis not present

## 2024-02-21 DIAGNOSIS — M25559 Pain in unspecified hip: Secondary | ICD-10-CM | POA: Diagnosis not present

## 2024-02-21 DIAGNOSIS — S86811A Strain of other muscle(s) and tendon(s) at lower leg level, right leg, initial encounter: Secondary | ICD-10-CM | POA: Diagnosis not present

## 2024-02-26 DIAGNOSIS — S76819S Strain of other specified muscles, fascia and tendons at thigh level, unspecified thigh, sequela: Secondary | ICD-10-CM | POA: Diagnosis not present

## 2024-02-26 DIAGNOSIS — S86811A Strain of other muscle(s) and tendon(s) at lower leg level, right leg, initial encounter: Secondary | ICD-10-CM | POA: Diagnosis not present

## 2024-02-26 DIAGNOSIS — M25559 Pain in unspecified hip: Secondary | ICD-10-CM | POA: Diagnosis not present

## 2024-02-26 DIAGNOSIS — S39012A Strain of muscle, fascia and tendon of lower back, initial encounter: Secondary | ICD-10-CM | POA: Diagnosis not present

## 2024-02-28 ENCOUNTER — Ambulatory Visit (HOSPITAL_BASED_OUTPATIENT_CLINIC_OR_DEPARTMENT_OTHER): Admitting: Orthopaedic Surgery

## 2024-02-28 ENCOUNTER — Ambulatory Visit (INDEPENDENT_AMBULATORY_CARE_PROVIDER_SITE_OTHER)

## 2024-02-28 DIAGNOSIS — M25552 Pain in left hip: Secondary | ICD-10-CM

## 2024-02-28 DIAGNOSIS — M1612 Unilateral primary osteoarthritis, left hip: Secondary | ICD-10-CM | POA: Diagnosis not present

## 2024-02-28 DIAGNOSIS — M25559 Pain in unspecified hip: Secondary | ICD-10-CM | POA: Diagnosis not present

## 2024-02-28 NOTE — Progress Notes (Signed)
 Chief Complaint: Left hip pain     History of Present Illness:    Jacob Cox is a 73 y.o. male presents today with ongoing left hip pain over the course of the last several years.  States that this is tightening up when going from sit to stand.  He does have an upcoming trip planned for biking 3 weeks which he is excited for.  He and his wife do enjoy traveling.  He has been doing physical therapy for the hip without any relief.  He has been experiencing pain in the groin and hip area    PMH/PSH/Family History/Social History/Meds/Allergies:    Past Medical History:  Diagnosis Date   Hyperlipidemia    Panic attacks    Past Surgical History:  Procedure Laterality Date   CARDIAC CATHETERIZATION     Social History   Socioeconomic History   Marital status: Married    Spouse name: Not on file   Number of children: Not on file   Years of education: Not on file   Highest education level: Not on file  Occupational History   Not on file  Tobacco Use   Smoking status: Every Day    Types: Cigars   Smokeless tobacco: Never  Substance and Sexual Activity   Alcohol  use: Yes    Comment: 7-8 oz of alcohol  per week   Drug use: Never   Sexual activity: Not on file  Other Topics Concern   Not on file  Social History Narrative   Not on file   Social Drivers of Health   Financial Resource Strain: Not on file  Food Insecurity: Not on file  Transportation Needs: Not on file  Physical Activity: Not on file  Stress: Not on file  Social Connections: Not on file   Family History  Problem Relation Age of Onset   Colon cancer Neg Hx    Esophageal cancer Neg Hx    Stomach cancer Neg Hx    Esophageal varices Neg Hx    Allergies  Allergen Reactions   Sulfonamide Derivatives     REACTION: hives   Current Outpatient Medications  Medication Sig Dispense Refill   allopurinol (ZYLOPRIM) 100 MG tablet Take 100 mg by mouth daily.     aspirin EC 81 MG tablet Take 81 mg by  mouth daily.     Coenzyme Q10 (COQ10) 200 MG CAPS Take by mouth.     Multiple Vitamin (MULTIVITAMIN) tablet Take 1 tablet by mouth daily.     Multiple Vitamins-Minerals (ZINC PO) Take by mouth.     Omega-3 Fatty Acids (FISH OIL PO) Take by mouth.     simvastatin (ZOCOR) 40 MG tablet Take 40 mg by mouth daily.     UNABLE TO FIND Chiroklenz tea with psyllium husk every pm     No current facility-administered medications for this visit.   No results found.  Review of Systems:   A ROS was performed including pertinent positives and negatives as documented in the HPI.  Physical Exam :   Constitutional: NAD and appears stated age Neurological: Alert and oriented Psych: Appropriate affect and cooperative There were no vitals taken for this visit.   Comprehensive Musculoskeletal Exam:    Left hip with tenderness about the femoral acetabular joint and positive FADIR.  Walks with mildly antalgic gait.  20 degrees internal/external rotation of the left hip.   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   Assessment and Plan:   73 y.o. male with significant osteoarthritis of the left hip joint.  He has been participating in physical therapy without any relief.  Given this we did discuss treatment options.  I did discuss the possibility of an ultrasound-guided injection versus hip arthroplasty.  At this time he would like to further consider left total hip arthroplasty.  Given this we will plan for referral to Dr. Vernetta  -Plan for referral to Dr. Vernetta for discussion of left total hip arthroplasty   After a lengthy discussion of treatment options, including risks, benefits, alternatives, complications of surgical and nonsurgical conservative options, the patient elected surgical repair.   The patient  is aware of the material risks  and complications including, but not  limited to injury to adjacent structures, neurovascular injury, infection, numbness, bleeding, implant failure, thermal burns, stiffness, persistent pain, failure to heal, disease transmission from allograft, need for further surgery, dislocation, anesthetic risks, blood clots, risks of death,and others. The probabilities of surgical success and failure discussed with patient given their particular co-morbidities.The time and nature of expected rehabilitation and recovery was discussed.The patient's questions were all answered preoperatively.  No barriers to understanding were noted. I explained the natural history of the disease process and Rx rationale.  I explained to the patient what I considered to be reasonable expectations given their personal situation.  The final treatment plan was arrived at through a shared patient decision making process model.    I personally saw and evaluated the patient, and participated in the management and treatment plan.  Elspeth Parker, MD Attending Physician, Orthopedic Surgery  This document was dictated using Dragon voice recognition software. A reasonable attempt at proof reading has been made to minimize errors.

## 2024-02-29 DIAGNOSIS — S86811A Strain of other muscle(s) and tendon(s) at lower leg level, right leg, initial encounter: Secondary | ICD-10-CM | POA: Diagnosis not present

## 2024-02-29 DIAGNOSIS — S39012A Strain of muscle, fascia and tendon of lower back, initial encounter: Secondary | ICD-10-CM | POA: Diagnosis not present

## 2024-02-29 DIAGNOSIS — M25559 Pain in unspecified hip: Secondary | ICD-10-CM | POA: Diagnosis not present

## 2024-02-29 DIAGNOSIS — S76819S Strain of other specified muscles, fascia and tendons at thigh level, unspecified thigh, sequela: Secondary | ICD-10-CM | POA: Diagnosis not present

## 2024-03-05 DIAGNOSIS — S76819S Strain of other specified muscles, fascia and tendons at thigh level, unspecified thigh, sequela: Secondary | ICD-10-CM | POA: Diagnosis not present

## 2024-03-05 DIAGNOSIS — S39012A Strain of muscle, fascia and tendon of lower back, initial encounter: Secondary | ICD-10-CM | POA: Diagnosis not present

## 2024-03-05 DIAGNOSIS — M25559 Pain in unspecified hip: Secondary | ICD-10-CM | POA: Diagnosis not present

## 2024-03-05 DIAGNOSIS — S86811A Strain of other muscle(s) and tendon(s) at lower leg level, right leg, initial encounter: Secondary | ICD-10-CM | POA: Diagnosis not present

## 2024-03-07 DIAGNOSIS — S76819S Strain of other specified muscles, fascia and tendons at thigh level, unspecified thigh, sequela: Secondary | ICD-10-CM | POA: Diagnosis not present

## 2024-03-07 DIAGNOSIS — S39012A Strain of muscle, fascia and tendon of lower back, initial encounter: Secondary | ICD-10-CM | POA: Diagnosis not present

## 2024-03-07 DIAGNOSIS — S86811A Strain of other muscle(s) and tendon(s) at lower leg level, right leg, initial encounter: Secondary | ICD-10-CM | POA: Diagnosis not present

## 2024-03-07 DIAGNOSIS — M25559 Pain in unspecified hip: Secondary | ICD-10-CM | POA: Diagnosis not present

## 2024-03-11 ENCOUNTER — Telehealth (HOSPITAL_BASED_OUTPATIENT_CLINIC_OR_DEPARTMENT_OTHER): Payer: Self-pay | Admitting: Orthopaedic Surgery

## 2024-03-11 NOTE — Telephone Encounter (Signed)
 Channing from [facility] called with results for Amron Guerrette please call back to let them know you have this result [phone]

## 2024-03-12 ENCOUNTER — Telehealth: Payer: Self-pay | Admitting: Orthopaedic Surgery

## 2024-03-12 DIAGNOSIS — S39012A Strain of muscle, fascia and tendon of lower back, initial encounter: Secondary | ICD-10-CM | POA: Diagnosis not present

## 2024-03-12 DIAGNOSIS — S76819S Strain of other specified muscles, fascia and tendons at thigh level, unspecified thigh, sequela: Secondary | ICD-10-CM | POA: Diagnosis not present

## 2024-03-12 DIAGNOSIS — S86811A Strain of other muscle(s) and tendon(s) at lower leg level, right leg, initial encounter: Secondary | ICD-10-CM | POA: Diagnosis not present

## 2024-03-12 DIAGNOSIS — M25559 Pain in unspecified hip: Secondary | ICD-10-CM | POA: Diagnosis not present

## 2024-03-12 NOTE — Telephone Encounter (Signed)
 [facility] called and said hip xray and pressure #2, possible erosibe change involving the left interior pubic ranus and symphysis. Recommend eval with CT or MRI. The full report is on the way. CB#[phone]

## 2024-03-12 NOTE — Telephone Encounter (Signed)
 Attempted CB, received to answer

## 2024-03-14 ENCOUNTER — Other Ambulatory Visit (HOSPITAL_BASED_OUTPATIENT_CLINIC_OR_DEPARTMENT_OTHER): Payer: Self-pay | Admitting: Orthopaedic Surgery

## 2024-03-14 DIAGNOSIS — S39012A Strain of muscle, fascia and tendon of lower back, initial encounter: Secondary | ICD-10-CM | POA: Diagnosis not present

## 2024-03-14 DIAGNOSIS — S76819S Strain of other specified muscles, fascia and tendons at thigh level, unspecified thigh, sequela: Secondary | ICD-10-CM | POA: Diagnosis not present

## 2024-03-14 DIAGNOSIS — M25559 Pain in unspecified hip: Secondary | ICD-10-CM | POA: Diagnosis not present

## 2024-03-14 DIAGNOSIS — S86811A Strain of other muscle(s) and tendon(s) at lower leg level, right leg, initial encounter: Secondary | ICD-10-CM | POA: Diagnosis not present

## 2024-03-14 DIAGNOSIS — M25552 Pain in left hip: Secondary | ICD-10-CM

## 2024-03-14 NOTE — Telephone Encounter (Signed)
 CT ordered.

## 2024-03-17 ENCOUNTER — Ambulatory Visit (HOSPITAL_BASED_OUTPATIENT_CLINIC_OR_DEPARTMENT_OTHER)
Admission: RE | Admit: 2024-03-17 | Discharge: 2024-03-17 | Disposition: A | Discharge status: Home / Self Care | Source: Ambulatory Visit | Attending: Orthopaedic Surgery | Admitting: Orthopaedic Surgery

## 2024-03-17 DIAGNOSIS — M25552 Pain in left hip: Secondary | ICD-10-CM | POA: Insufficient documentation

## 2024-03-17 DIAGNOSIS — K5730 Diverticulosis of large intestine without perforation or abscess without bleeding: Secondary | ICD-10-CM | POA: Diagnosis not present

## 2024-04-03 ENCOUNTER — Ambulatory Visit: Admitting: Orthopaedic Surgery

## 2024-04-03 ENCOUNTER — Encounter: Payer: Self-pay | Admitting: Orthopaedic Surgery

## 2024-04-03 VITALS — Ht 66.14 in | Wt 191.8 lb

## 2024-04-03 DIAGNOSIS — M1612 Unilateral primary osteoarthritis, left hip: Secondary | ICD-10-CM | POA: Insufficient documentation

## 2024-04-03 DIAGNOSIS — M25552 Pain in left hip: Secondary | ICD-10-CM | POA: Diagnosis not present

## 2024-04-03 NOTE — Progress Notes (Signed)
 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  Has been hurting for more than a year now.  He and his wife got back from her cruise recently and his pain again worsened causing some knee pain and back pain as well.  He is not a diabetic.  He smokes a cigar on occasion and drinks alcohol  on occasion.  He is not on blood thinning medications other than aspirin.  His wife is with him today as well.  At this point his left hip pain is daily and it is definitely affecting his mobility, his quality of life and his actives daily living.  I did review all his medications and past medical history within epic.  On exam his right hip moves smoothly and fluidly.  The left hip has significant pain in the groin with internal and external rotation and significant stiffness with rotation.  AP pelvis and lateral of the left hip as well as a CT scan of the canopy system shows severe end-stage bone-on-bone arthritis with complete loss of joint space as well as sclerotic and cystic changes of the left hip.  The right hip space is well-maintained and normal appearing.  We had a long and thorough discussion about hip replacement surgery.  We discussed the risks and benefits of the surgery and what to expect from an intraoperative and postoperative standpoint.  We went over the imaging studies and I did give him a hand about hip replacement surgery.  All questions and concerns were answered and addressed.  Will work on getting on the schedule soon for a left total hip.

## 2024-04-04 DIAGNOSIS — J069 Acute upper respiratory infection, unspecified: Secondary | ICD-10-CM | POA: Diagnosis not present

## 2024-04-04 DIAGNOSIS — M1612 Unilateral primary osteoarthritis, left hip: Secondary | ICD-10-CM | POA: Diagnosis not present

## 2024-04-04 DIAGNOSIS — R058 Other specified cough: Secondary | ICD-10-CM | POA: Diagnosis not present

## 2024-04-11 DIAGNOSIS — C4441 Basal cell carcinoma of skin of scalp and neck: Secondary | ICD-10-CM | POA: Diagnosis not present

## 2024-04-11 DIAGNOSIS — L905 Scar conditions and fibrosis of skin: Secondary | ICD-10-CM | POA: Diagnosis not present

## 2024-04-11 DIAGNOSIS — L438 Other lichen planus: Secondary | ICD-10-CM | POA: Diagnosis not present

## 2024-04-11 DIAGNOSIS — Z85820 Personal history of malignant melanoma of skin: Secondary | ICD-10-CM | POA: Diagnosis not present

## 2024-04-11 DIAGNOSIS — D224 Melanocytic nevi of scalp and neck: Secondary | ICD-10-CM | POA: Diagnosis not present

## 2024-04-11 DIAGNOSIS — D2261 Melanocytic nevi of right upper limb, including shoulder: Secondary | ICD-10-CM | POA: Diagnosis not present

## 2024-04-11 DIAGNOSIS — L821 Other seborrheic keratosis: Secondary | ICD-10-CM | POA: Diagnosis not present

## 2024-04-11 DIAGNOSIS — L820 Inflamed seborrheic keratosis: Secondary | ICD-10-CM | POA: Diagnosis not present

## 2024-04-11 DIAGNOSIS — L738 Other specified follicular disorders: Secondary | ICD-10-CM | POA: Diagnosis not present

## 2024-04-11 DIAGNOSIS — Z85828 Personal history of other malignant neoplasm of skin: Secondary | ICD-10-CM | POA: Diagnosis not present

## 2024-04-11 DIAGNOSIS — D1801 Hemangioma of skin and subcutaneous tissue: Secondary | ICD-10-CM | POA: Diagnosis not present

## 2024-05-06 ENCOUNTER — Encounter: Payer: Self-pay | Admitting: Radiology

## 2024-05-08 NOTE — Progress Notes (Signed)
 Sent message, via epic in basket, requesting orders in epic from Careers adviser.

## 2024-05-09 ENCOUNTER — Other Ambulatory Visit: Payer: Self-pay | Admitting: Physician Assistant

## 2024-05-09 DIAGNOSIS — Z01818 Encounter for other preprocedural examination: Secondary | ICD-10-CM

## 2024-05-14 NOTE — Progress Notes (Signed)
 Date of COVID positive in last 90 days:  PCP - Charlie Reas, MD Cardiologist - n/a  Chest x-ray - yes with PCP EKG - N/A Stress Test - N/A ECHO - N/A Cardiac Cath - 40 years ago Pacemaker/ICD device last checked:N/A Spinal Cord Stimulator:N/A  Bowel Prep - N/A  Sleep Study - N/A CPAP -   Fasting Blood Sugar - N/A Checks Blood Sugar _____ times a day  Last dose of GLP1 agonist-  N/A GLP1 instructions:  Do not take after     Last dose of SGLT-2 inhibitors-  N/A SGLT-2 instructions:  Do not take after     Blood Thinner Instructions: N/A Last dose:   Time: Aspirin Instructions: ASA 81, hold 5 days Last Dose:  Activity level: Can go up a flight of stairs and perform activities of daily living without stopping and without symptoms of chest pain or shortness of breath.   Anesthesia review: K+ 5.7,   Patient denies shortness of breath, fever, cough and chest pain at PAT appointment  Patient verbalized understanding of instructions that were given to them at the PAT appointment. Patient was also instructed that they will need to review over the PAT instructions again at home before surgery.

## 2024-05-14 NOTE — Patient Instructions (Addendum)
 SURGICAL WAITING ROOM VISITATION  Patients having surgery or a procedure may have no more than 2 support people in the waiting area - these visitors may rotate.    Children under the age of 23 must have an adult with them who is not the patient.  Visitors with respiratory illnesses are discouraged from visiting and should remain at home.  If the patient needs to stay at the hospital during part of their recovery, the visitor guidelines for inpatient rooms apply. Pre-op nurse will coordinate an appropriate time for 1 support person to accompany patient in pre-op.  This support person may not rotate.    Please refer to the [facility] website for the visitor guidelines for Inpatients (after your surgery is over and you are in a regular room).    Your procedure is scheduled on: 05/24/24   Report to [facility] Main Entrance    Report to admitting at 5:15 AM   Call this number if you have problems the morning of surgery [phone]   Do not eat food :After Midnight.   After Midnight you may have the following liquids until 4:15 AM DAY OF SURGERY  Water Non-Citrus Juices (without pulp, NO RED-Apple, White grape, White cranberry) Black Coffee (NO MILK/CREAM OR CREAMERS, sugar ok)  Clear Tea (NO MILK/CREAM OR CREAMERS, sugar ok) regular and decaf                             Plain Jell-O (NO RED)                                           Fruit ices (not with fruit pulp, NO RED)                                     Popsicles (NO RED)                                                               Sports drinks like Gatorade (NO RED)    The day of surgery:  Drink ONE (1) Pre-Surgery Clear Ensure at 4:15 AM the morning of surgery. Drink in one sitting. Do not sip.  This drink was given to you during your hospital  pre-op appointment visit. Nothing else to drink after completing the  Pre-Surgery Clear Ensure          If you have questions, please contact your surgeon's  office.   FOLLOW BOWEL PREP AND ANY ADDITIONAL PRE OP INSTRUCTIONS YOU RECEIVED FROM YOUR SURGEON'S OFFICE!!!     Oral Hygiene is also important to reduce your risk of infection.                                    Remember - BRUSH YOUR TEETH THE MORNING OF SURGERY WITH YOUR REGULAR TOOTHPASTE  DENTURES WILL BE REMOVED PRIOR TO SURGERY PLEASE DO NOT APPLY Poly grip OR ADHESIVES!!!   Do NOT smoke after Midnight   Stop all vitamins  and herbal supplements 7 days before surgery.   Take these medicines the morning of surgery with A SIP OF WATER: Allopurinol, Simvastatin   DO NOT TAKE ANY ORAL DIABETIC MEDICATIONS DAY OF YOUR SURGERY  Bring CPAP mask and tubing day of surgery.                              You may not have any metal on your body including jewelry, and body piercing             Do not wear, lotions, powders, cologne, or deodorant              Men may shave face and neck.   Do not bring valuables to the hospital. [facility] IS NOT             RESPONSIBLE   FOR VALUABLES.   Contacts, glasses, dentures or bridgework may not be worn into surgery.   Bring small overnight bag day of surgery.   DO NOT BRING YOUR HOME MEDICATIONS TO THE HOSPITAL. PHARMACY WILL DISPENSE MEDICATIONS LISTED ON YOUR MEDICATION LIST TO YOU DURING YOUR ADMISSION IN THE HOSPITAL!    Special Instructions: Bring a copy of your healthcare power of attorney and living will documents the day of surgery if you haven't scanned them before.              Please read over the following fact sheets you were given: IF YOU HAVE QUESTIONS ABOUT YOUR PRE-OP INSTRUCTIONS PLEASE CALL [phone].   If you received a COVID test during your pre-op visit  it is requested that you wear a mask when out in public, stay away from anyone that may not be feeling well and notify your surgeon if you develop symptoms. If you test positive for Covid or have been in contact with anyone that has tested positive in the  last 10 days please notify you surgeon.      Pre-operative 4 CHG Bath Instructions  DYNA-Hex 4 Chlorhexidine Gluconate 4% Solution Antiseptic 4 fl. oz   You can play a key role in reducing the risk of infection after surgery. Your skin needs to be as free of germs as possible. You can reduce the number of germs on your skin by washing with CHG (chlorhexidine gluconate) soap before surgery. CHG is an antiseptic soap that kills germs and continues to kill germs even after washing.   DO NOT use if you have an allergy to chlorhexidine/CHG or antibacterial soaps. If your skin becomes reddened or irritated, stop using the CHG and notify one of our RNs at   Please shower with the CHG soap starting 4 days before surgery using the following schedule:     Please keep in mind the following:  DO NOT shave, including legs and underarms, starting the day of your first shower.   You may shave your face at any point before/day of surgery.  Place clean sheets on your bed the day you start using CHG soap. Use a clean washcloth (not used since being washed) for each shower. DO NOT sleep with pets once you start using the CHG.  CHG Shower Instructions:  If you choose to wash your hair and private area, wash first with your normal shampoo/soap.  After you use shampoo/soap, rinse your hair and body thoroughly to remove shampoo/soap residue.  Turn the water OFF and apply about 3 tablespoons (45 ml) of CHG soap to  a Animal Nutritionist.  Apply CHG soap ONLY FROM YOUR NECK DOWN TO YOUR TOES (washing for 3-5 minutes)  DO NOT use CHG soap on face, private areas, open wounds, or sores.  Pay special attention to the area where your surgery is being performed.  If you are having back surgery, having someone wash your back for you may be helpful. Wait 2 minutes after CHG soap is applied, then you may rinse off the CHG soap.  Pat dry with a clean towel  Put on clean clothes/pajamas   If you choose to wear lotion,  please use ONLY the CHG-compatible lotions on the back of this paper.     Additional instructions for the day of surgery: DO NOT APPLY any lotions, deodorants, cologne, or perfumes.   Put on clean/comfortable clothes.  Brush your teeth.  Ask your nurse before applying any prescription medications to the skin.   CHG Compatible Lotions   Aveeno Moisturizing lotion  Cetaphil Moisturizing Cream  Cetaphil Moisturizing Lotion  Clairol Herbal Essence Moisturizing Lotion, Dry Skin  Clairol Herbal Essence Moisturizing Lotion, Extra Dry Skin  Clairol Herbal Essence Moisturizing Lotion, Normal Skin  Curel Age Defying Therapeutic Moisturizing Lotion with Alpha Hydroxy  Curel Extreme Care Body Lotion  Curel Soothing Hands Moisturizing Hand Lotion  Curel Therapeutic Moisturizing Cream, Fragrance-Free  Curel Therapeutic Moisturizing Lotion, Fragrance-Free  Curel Therapeutic Moisturizing Lotion, Original Formula  Eucerin Daily Replenishing Lotion  Eucerin Dry Skin Therapy Plus Alpha Hydroxy Crme  Eucerin Dry Skin Therapy Plus Alpha Hydroxy Lotion  Eucerin Original Crme  Eucerin Original Lotion  Eucerin Plus Crme Eucerin Plus Lotion  Eucerin TriLipid Replenishing Lotion  Keri Anti-Bacterial Hand Lotion  Keri Deep Conditioning Original Lotion Dry Skin Formula Softly Scented  Keri Deep Conditioning Original Lotion, Fragrance Free Sensitive Skin Formula  Keri Lotion Fast Absorbing Fragrance Free Sensitive Skin Formula  Keri Lotion Fast Absorbing Softly Scented Dry Skin Formula  Keri Original Lotion  Keri Skin Renewal Lotion Keri Silky Smooth Lotion  Keri Silky Smooth Sensitive Skin Lotion  Nivea Body Creamy Conditioning Oil  Nivea Body Extra Enriched Lotion  Nivea Body Original Lotion  Nivea Body Sheer Moisturizing Lotion Nivea Crme  Nivea Skin Firming Lotion  NutraDerm 30 Skin Lotion  NutraDerm Skin Lotion  NutraDerm Therapeutic Skin Cream  NutraDerm Therapeutic Skin Lotion  ProShield  Protective Hand Cream  Provon moisturizing lotion  View Pre-Surgery Education Videos:  [email or web address]     Incentive Spirometer  An incentive spirometer is a tool that can help keep your lungs clear and active. This tool measures how well you are filling your lungs with each breath. Taking long deep breaths may help reverse or decrease the chance of developing breathing (pulmonary) problems (especially infection) following: A long period of time when you are unable to move or be active. BEFORE THE PROCEDURE  If the spirometer includes an indicator to show your best effort, your nurse or respiratory therapist will set it to a desired goal. If possible, sit up straight or lean slightly forward. Try not to slouch. Hold the incentive spirometer in an upright position. INSTRUCTIONS FOR USE  Sit on the edge of your bed if possible, or sit up as far as you can in bed or on a chair. Hold the incentive spirometer in an upright position. Breathe out normally. Place the mouthpiece in your mouth and seal your lips tightly around it. Breathe in slowly and as deeply as possible, raising the piston or the ball toward the top of  the column. Hold your breath for 3-5 seconds or for as long as possible. Allow the piston or ball to fall to the bottom of the column. Remove the mouthpiece from your mouth and breathe out normally. Rest for a few seconds and repeat Steps 1 through 7 at least 10 times every 1-2 hours when you are awake. Take your time and take a few normal breaths between deep breaths. The spirometer may include an indicator to show your best effort. Use the indicator as a goal to work toward during each repetition. After each set of 10 deep breaths, practice coughing to be sure your lungs are clear. If you have an incision (the cut made at the time of surgery), support your incision when coughing by placing a pillow or rolled up  towels firmly against it. Once you are able to get out of bed, walk around indoors and cough well. You may stop using the incentive spirometer when instructed by your caregiver.  RISKS AND COMPLICATIONS Take your time so you do not get dizzy or light-headed. If you are in pain, you may need to take or ask for pain medication before doing incentive spirometry. It is harder to take a deep breath if you are having pain. AFTER USE Rest and breathe slowly and easily. It can be helpful to keep track of a log of your progress. Your caregiver can provide you with a simple table to help with this. If you are using the spirometer at home, follow these instructions: SEEK MEDICAL CARE IF:  You are having difficultly using the spirometer. You have trouble using the spirometer as often as instructed. Your pain medication is not giving enough relief while using the spirometer. You develop fever of 100.5 F (38.1 C) or higher. SEEK IMMEDIATE MEDICAL CARE IF:  You cough up bloody sputum that had not been present before. You develop fever of 102 F (38.9 C) or greater. You develop worsening pain at or near the incision site. MAKE SURE YOU:  Understand these instructions. Will watch your condition. Will get help right away if you are not doing well or get worse. Document Released: 10/31/2006 Document Revised: 09/12/2011 Document Reviewed: 01/01/2007 ExitCare Patient Information 2014 ExitCare, [place].   ________________________________________________________________________ WHAT IS A BLOOD TRANSFUSION? Blood Transfusion Information  A transfusion is the replacement of blood or some of its parts. Blood is made up of multiple cells which provide different functions. Red blood cells carry oxygen  and are used for blood loss replacement. White blood cells fight against infection. Platelets control bleeding. Plasma helps clot blood. Other blood products are available for specialized needs, such as  hemophilia or other clotting disorders. BEFORE THE TRANSFUSION  Who gives blood for transfusions?  Healthy volunteers who are fully evaluated to make sure their blood is safe. This is blood bank blood. Transfusion therapy is the safest it has ever been in the practice of medicine. Before blood is taken from a donor, a complete history is taken to make sure that person has no history of diseases nor engages in risky social behavior (examples are intravenous drug use or sexual activity with multiple partners). The donor's travel history is screened to minimize risk of transmitting infections, such as malaria. The donated blood is tested for signs of infectious diseases, such as HIV and hepatitis. The blood is then tested to be sure it is compatible with you in order to minimize the chance of a transfusion reaction. If you or a relative donates blood, this is often  done in anticipation of surgery and is not appropriate for emergency situations. It takes many days to process the donated blood. RISKS AND COMPLICATIONS Although transfusion therapy is very safe and saves many lives, the main dangers of transfusion include:  Getting an infectious disease. Developing a transfusion reaction. This is an allergic reaction to something in the blood you were given. Every precaution is taken to prevent this. The decision to have a blood transfusion has been considered carefully by your caregiver before blood is given. Blood is not given unless the benefits outweigh the risks. AFTER THE TRANSFUSION Right after receiving a blood transfusion, you will usually feel much better and more energetic. This is especially true if your red blood cells have gotten low (anemic). The transfusion raises the level of the red blood cells which carry oxygen , and this usually causes an energy increase. The nurse administering the transfusion will monitor you carefully for complications. HOME CARE INSTRUCTIONS  No special instructions  are needed after a transfusion. You may find your energy is better. Speak with your caregiver about any limitations on activity for underlying diseases you may have. SEEK MEDICAL CARE IF:  Your condition is not improving after your transfusion. You develop redness or irritation at the intravenous (IV) site. SEEK IMMEDIATE MEDICAL CARE IF:  Any of the following symptoms occur over the next 12 hours: Shaking chills. You have a temperature by mouth above 102 F (38.9 C), not controlled by medicine. Chest, back, or muscle pain. People around you feel you are not acting correctly or are confused. Shortness of breath or difficulty breathing. Dizziness and fainting. You get a rash or develop hives. You have a decrease in urine output. Your urine turns a dark color or changes to pink, red, or brown. Any of the following symptoms occur over the next 10 days: You have a temperature by mouth above 102 F (38.9 C), not controlled by medicine. Shortness of breath. Weakness after normal activity. The white part of the eye turns yellow (jaundice). You have a decrease in the amount of urine or are urinating less often. Your urine turns a dark color or changes to pink, red, or brown. Document Released: 06/17/2000 Document Revised: 09/12/2011 Document Reviewed: 02/04/2008 [facility] Patient Information 2014 [place], [place].  _______________________________________________________________________

## 2024-05-15 ENCOUNTER — Other Ambulatory Visit: Payer: Self-pay

## 2024-05-15 ENCOUNTER — Encounter (HOSPITAL_COMMUNITY)
Admission: RE | Admit: 2024-05-15 | Discharge: 2024-05-15 | Disposition: A | Discharge status: Home / Self Care | Source: Ambulatory Visit | Attending: Orthopaedic Surgery | Admitting: Orthopaedic Surgery

## 2024-05-15 ENCOUNTER — Encounter (HOSPITAL_COMMUNITY): Payer: Self-pay

## 2024-05-15 VITALS — BP 137/94 | HR 86 | Temp 98.4°F | Resp 14 | Ht 68.0 in | Wt 192.0 lb

## 2024-05-15 DIAGNOSIS — Z01818 Encounter for other preprocedural examination: Secondary | ICD-10-CM

## 2024-05-15 DIAGNOSIS — Z01812 Encounter for preprocedural laboratory examination: Secondary | ICD-10-CM | POA: Insufficient documentation

## 2024-05-15 HISTORY — DX: Gastro-esophageal reflux disease without esophagitis: K21.9

## 2024-05-15 HISTORY — DX: Unspecified osteoarthritis, unspecified site: M19.90

## 2024-05-15 HISTORY — DX: Pneumonia, unspecified organism: J18.9

## 2024-05-15 HISTORY — DX: Gout, unspecified: M10.9

## 2024-05-15 HISTORY — DX: Other complications of anesthesia, initial encounter: T88.59XA

## 2024-05-15 HISTORY — DX: Malignant (primary) neoplasm, unspecified: C80.1

## 2024-05-15 LAB — CBC
HCT: 40.6 % (ref 39.0–52.0)
Hemoglobin: 13.6 g/dL (ref 13.0–17.0)
MCH: 33.1 pg (ref 26.0–34.0)
MCHC: 33.5 g/dL (ref 30.0–36.0)
MCV: 98.8 fL (ref 80.0–100.0)
Platelets: 198 K/uL (ref 150–400)
RBC: 4.11 MIL/uL — ABNORMAL LOW (ref 4.22–5.81)
RDW: 12.4 % (ref 11.5–15.5)
WBC: 9.1 K/uL (ref 4.0–10.5)
nRBC: 0 % (ref 0.0–0.2)

## 2024-05-15 LAB — BASIC METABOLIC PANEL WITH GFR
Anion gap: 9 (ref 5–15)
BUN: 22 mg/dL (ref 8–23)
CO2: 26 mmol/L (ref 22–32)
Calcium: 10.7 mg/dL — ABNORMAL HIGH (ref 8.9–10.3)
Chloride: 104 mmol/L (ref 98–111)
Creatinine, Ser: 1.29 mg/dL — ABNORMAL HIGH (ref 0.61–1.24)
GFR, Estimated: 59 mL/min — ABNORMAL LOW (ref >60)
Glucose, Bld: 80 mg/dL (ref 70–99)
Potassium: 5.7 mmol/L — ABNORMAL HIGH (ref 3.5–5.1)
Sodium: 139 mmol/L (ref 135–145)

## 2024-05-15 LAB — SURGICAL PCR SCREEN
MRSA, PCR: NEGATIVE
Staphylococcus aureus: POSITIVE — AB

## 2024-05-16 NOTE — Progress Notes (Signed)
[facility] + results routed to Dr. Blackman 

## 2024-05-20 NOTE — Care Plan (Signed)
 Ortho Bundle Case Management Note  Patient Details  Name: Jacob Cox MRN: [id number] Date of Birth: August 29, 1950   [facility] RNCM call to patient to discuss his upcoming Left total hip arthroplasty with Dr. Vernetta on 05/24/24 at [facility]. He is agreeable to case management. He lives with his spouse, who will be assisting at home after discharge. He will need a RW. Order sent to [facility] for delivery to hospital prior to d/c. Anticipate HHPT will be needed after a short hospital stay. Referral sent to [facility] after choice provided. Reviewed post op care instructions and copy emailed to patient prior to surgery for his review. Answered questions. Will continue to follow for needs.                  DME Arranged:  Vannie rolling DME Agency:  [facility]  HH Arranged:  PT HH Agency:  [facility]  Additional Comments: Please contact me with any questions of if this plan should need to change.  Tylene Ned, RN, BSN, [employer]  [phone] 05/20/2024, 11:37 AM

## 2024-05-23 NOTE — H&P (Signed)
 TOTAL HIP ADMISSION H&P  Patient is admitted for left total hip arthroplasty.  Subjective:  Chief Complaint: left hip pain  HPI: Jacob Cox, 73 y.o. male, has a history of pain and functional disability in the left hip(s) due to arthritis and patient has failed non-surgical conservative treatments for greater than 12 weeks to include NSAID's and/or analgesics, corticosteriod injections, flexibility and strengthening excercises, use of assistive devices, and weight reduction as appropriate.  Onset of symptoms was gradual starting 1 years ago with gradually worsening course since that time.The patient noted no past surgery on the left hip(s).  Patient currently rates pain in the left hip at 10 out of 10 with activity. Patient has night pain, worsening of pain with activity and weight bearing, trendelenberg gait, pain that interfers with activities of daily living, and pain with passive range of motion. Patient has evidence of subchondral cysts, subchondral sclerosis, periarticular osteophytes, and joint space narrowing by imaging studies. This condition presents safety issues increasing the risk of falls.  There is no current active infection.  Patient Active Problem List   Diagnosis Date Noted   Unilateral primary osteoarthritis, left hip 04/03/2024   Past Medical History:  Diagnosis Date   Arthritis    Cancer (HCC)    stage 0 basal and melenoma   Complication of anesthesia    SLow to wake up cardiac cath   GERD (gastroesophageal reflux disease)    Gout    Hyperlipidemia    Panic attacks    Pneumonia     Past Surgical History:  Procedure Laterality Date   CARDIAC CATHETERIZATION     CATARACT EXTRACTION Bilateral    COLONOSCOPY     x2    No current facility-administered medications for this encounter.   Current Outpatient Medications  Medication Sig Dispense Refill Last Dose/Taking   allopurinol (ZYLOPRIM) 100 MG tablet Take 200 mg by mouth in the morning.   Taking    Ascorbic Acid (VITAMIN C) 1000 MG tablet Take 1,000 mg by mouth daily.   Taking   aspirin EC 81 MG tablet Take 81 mg by mouth daily.   Taking   Coenzyme Q10 (COQ10) 200 MG CAPS Take 200 mg by mouth daily at 12 noon.   Taking   Ferrous Sulfate (IRON) 325 (65 Fe) MG TABS Take 2 tablets by mouth daily at 12 noon.   Taking   Omega-3 Fatty Acids (FISH OIL PO) Take 1,560 mg by mouth daily at 12 noon. 1 teaspoon daily   Taking   simvastatin (ZOCOR) 40 MG tablet Take 40 mg by mouth daily.   Taking   UNABLE TO FIND Chiroklenz tea with psyllium husk every pm   Taking   Zinc 50 MG TABS Take 1 tablet by mouth daily at 12 noon.   Taking   Allergies  Allergen Reactions   Sulfonamide Derivatives     REACTION: hives    Social History   Tobacco Use   Smoking status: Every Day    Types: Cigars   Smokeless tobacco: Never  Substance Use Topics   Alcohol  use: Yes    Comment: 7-8 oz of alcohol  per week    Family History  Problem Relation Age of Onset   Colon cancer Neg Hx    Esophageal cancer Neg Hx    Stomach cancer Neg Hx    Esophageal varices Neg Hx      Review of Systems  Objective:  Physical Exam Vitals reviewed.  Constitutional:  Appearance: Normal appearance.  HENT:     Head: Normocephalic and atraumatic.  Eyes:     Extraocular Movements: Extraocular movements intact.     Pupils: Pupils are equal, round, and reactive to light.  Cardiovascular:     Rate and Rhythm: Normal rate and regular rhythm.  Pulmonary:     Effort: Pulmonary effort is normal.     Breath sounds: Normal breath sounds.  Abdominal:     Palpations: Abdomen is soft.  Musculoskeletal:     Cervical back: Normal range of motion and neck supple.     Left hip: Tenderness and bony tenderness present. Decreased range of motion. Decreased strength.  Neurological:     Mental Status: He is alert and oriented to person, place, and time.  Psychiatric:        Behavior: Behavior normal.     Vital signs in last 24  hours:    Labs:   Estimated body mass index is 29.19 kg/m as calculated from the following:   Height as of 05/15/24: 5' 8 (1.727 m).   Weight as of 05/15/24: 87.1 kg.   Imaging Review Plain radiographs demonstrate severe degenerative joint disease of the left hip(s). The bone quality appears to be good for age and reported activity level.      Assessment/Plan:  End stage arthritis, left hip(s)  The patient history, physical examination, clinical judgement of the provider and imaging studies are consistent with end stage degenerative joint disease of the left hip(s) and total hip arthroplasty is deemed medically necessary. The treatment options including medical management, injection therapy, arthroscopy and arthroplasty were discussed at length. The risks and benefits of total hip arthroplasty were presented and reviewed. The risks due to aseptic loosening, infection, stiffness, dislocation/subluxation,  thromboembolic complications and other imponderables were discussed.  The patient acknowledged the explanation, agreed to proceed with the plan and consent was signed. Patient is being admitted for inpatient treatment for surgery, pain control, PT, OT, prophylactic antibiotics, VTE prophylaxis, progressive ambulation and ADL's and discharge planning.The patient is planning to be discharged home with home health services

## 2024-05-24 ENCOUNTER — Observation Stay (HOSPITAL_COMMUNITY)

## 2024-05-24 ENCOUNTER — Ambulatory Visit (HOSPITAL_COMMUNITY)

## 2024-05-24 ENCOUNTER — Encounter (HOSPITAL_COMMUNITY)
Admission: RE | Disposition: A | Discharge status: Home / Self Care | Payer: Self-pay | Source: Home / Self Care | Attending: Orthopaedic Surgery

## 2024-05-24 ENCOUNTER — Encounter (HOSPITAL_COMMUNITY): Payer: Self-pay | Admitting: Orthopaedic Surgery

## 2024-05-24 ENCOUNTER — Ambulatory Visit (HOSPITAL_COMMUNITY): Payer: Self-pay | Admitting: Physician Assistant

## 2024-05-24 ENCOUNTER — Ambulatory Visit (HOSPITAL_BASED_OUTPATIENT_CLINIC_OR_DEPARTMENT_OTHER): Payer: Self-pay | Admitting: Anesthesiology

## 2024-05-24 ENCOUNTER — Other Ambulatory Visit: Payer: Self-pay

## 2024-05-24 ENCOUNTER — Observation Stay (HOSPITAL_COMMUNITY)
Admission: RE | Admit: 2024-05-24 | Discharge: 2024-05-25 | Disposition: A | Discharge status: Home / Self Care | Attending: Orthopaedic Surgery | Admitting: Orthopaedic Surgery

## 2024-05-24 DIAGNOSIS — M1612 Unilateral primary osteoarthritis, left hip: Secondary | ICD-10-CM

## 2024-05-24 DIAGNOSIS — Z471 Aftercare following joint replacement surgery: Secondary | ICD-10-CM | POA: Diagnosis not present

## 2024-05-24 DIAGNOSIS — Z7982 Long term (current) use of aspirin: Secondary | ICD-10-CM | POA: Diagnosis not present

## 2024-05-24 DIAGNOSIS — Z85828 Personal history of other malignant neoplasm of skin: Secondary | ICD-10-CM | POA: Diagnosis not present

## 2024-05-24 DIAGNOSIS — M25552 Pain in left hip: Secondary | ICD-10-CM | POA: Diagnosis present

## 2024-05-24 DIAGNOSIS — F17210 Nicotine dependence, cigarettes, uncomplicated: Secondary | ICD-10-CM | POA: Diagnosis not present

## 2024-05-24 DIAGNOSIS — Z96642 Presence of left artificial hip joint: Secondary | ICD-10-CM | POA: Diagnosis not present

## 2024-05-24 HISTORY — PX: TOTAL HIP ARTHROPLASTY: SHX124

## 2024-05-24 LAB — BASIC METABOLIC PANEL WITH GFR
Anion gap: 12 (ref 5–15)
BUN: 20 mg/dL (ref 8–23)
CO2: 22 mmol/L (ref 22–32)
Calcium: 10 mg/dL (ref 8.9–10.3)
Chloride: 106 mmol/L (ref 98–111)
Creatinine, Ser: 1.23 mg/dL (ref 0.61–1.24)
GFR, Estimated: >60 mL/min (ref >60)
Glucose, Bld: 119 mg/dL — ABNORMAL HIGH (ref 70–99)
Potassium: 3.7 mmol/L (ref 3.5–5.1)
Sodium: 140 mmol/L (ref 135–145)

## 2024-05-24 LAB — ABO/RH: ABO/RH(D): B POS

## 2024-05-24 LAB — TYPE AND SCREEN
ABO/RH(D): B POS
Antibody Screen: NEGATIVE

## 2024-05-24 SURGERY — ARTHROPLASTY, HIP, TOTAL, ANTERIOR APPROACH
Anesthesia: General | Site: Hip | Laterality: Left

## 2024-05-24 MED ORDER — SUGAMMADEX SODIUM 200 MG/2ML IV SOLN
INTRAVENOUS | Status: AC
  Filled 2024-05-24: qty 2

## 2024-05-24 MED ORDER — FERROUS SULFATE 325 (65 FE) MG PO TABS
650.0000 mg | ORAL_TABLET | Freq: Every day | ORAL | Status: DC
  Administered 2024-05-25: 650 mg via ORAL
  Filled 2024-05-24: qty 2

## 2024-05-24 MED ORDER — OXYCODONE HCL 5 MG/5ML PO SOLN: 5.0000 mg | Freq: Once | ORAL | Status: DC | PRN

## 2024-05-24 MED ORDER — DROPERIDOL 2.5 MG/ML IJ SOLN: 0.6250 mg | Freq: Once | INTRAMUSCULAR | Status: DC | PRN

## 2024-05-24 MED ORDER — MUPIROCIN 2 % EX OINT: 1.0000 | TOPICAL_OINTMENT | Freq: Two times a day (BID) | CUTANEOUS | 0 refills | Status: AC

## 2024-05-24 MED ORDER — VITAMIN C 500 MG PO TABS
1000.0000 mg | ORAL_TABLET | Freq: Every day | ORAL | Status: DC
  Administered 2024-05-25: 1000 mg via ORAL
  Filled 2024-05-24: qty 2

## 2024-05-24 MED ORDER — ASPIRIN 81 MG PO CHEW
81.0000 mg | CHEWABLE_TABLET | Freq: Two times a day (BID) | ORAL | Status: DC
  Administered 2024-05-24 – 2024-05-25 (×2): 81 mg via ORAL
  Filled 2024-05-24 (×2): qty 1

## 2024-05-24 MED ORDER — ACETAMINOPHEN 10 MG/ML IV SOLN: 1000.0000 mg | Freq: Once | INTRAVENOUS | Status: DC | PRN

## 2024-05-24 MED ORDER — METOCLOPRAMIDE HCL 5 MG/ML IJ SOLN: 5.0000 mg | Freq: Three times a day (TID) | INTRAMUSCULAR | Status: DC | PRN

## 2024-05-24 MED ORDER — SUGAMMADEX SODIUM 200 MG/2ML IV SOLN
INTRAVENOUS | Status: DC | PRN
  Administered 2024-05-24: 200 mg via INTRAVENOUS

## 2024-05-24 MED ORDER — DOCUSATE SODIUM 100 MG PO CAPS
100.0000 mg | ORAL_CAPSULE | Freq: Two times a day (BID) | ORAL | Status: DC
  Administered 2024-05-24 – 2024-05-25 (×2): 100 mg via ORAL
  Filled 2024-05-24 (×2): qty 1

## 2024-05-24 MED ORDER — FENTANYL CITRATE (PF) 100 MCG/2ML IJ SOLN
INTRAMUSCULAR | Status: DC | PRN
Start: 2024-05-24 — End: 2024-05-24
  Administered 2024-05-24: 50 ug via INTRAVENOUS
  Administered 2024-05-24 (×2): 25 ug via INTRAVENOUS
  Administered 2024-05-24: 50 ug via INTRAVENOUS
  Administered 2024-05-24 (×2): 25 ug via INTRAVENOUS

## 2024-05-24 MED ORDER — 0.9 % SODIUM CHLORIDE (POUR BTL) OPTIME
TOPICAL | Status: DC | PRN
  Administered 2024-05-24: 1000 mL

## 2024-05-24 MED ORDER — ALLOPURINOL 100 MG PO TABS
200.0000 mg | ORAL_TABLET | Freq: Every day | ORAL | Status: DC
  Administered 2024-05-25: 200 mg via ORAL
  Filled 2024-05-24: qty 2

## 2024-05-24 MED ORDER — SODIUM CHLORIDE 0.9 % IR SOLN
Status: DC | PRN
Start: 2024-05-24 — End: 2024-05-24
  Administered 2024-05-24: 1000 mL

## 2024-05-24 MED ORDER — CEFAZOLIN SODIUM-DEXTROSE 2-4 GM/100ML-% IV SOLN
2.0000 g | INTRAVENOUS | Status: AC
  Administered 2024-05-24: 2 g via INTRAVENOUS
  Filled 2024-05-24: qty 100

## 2024-05-24 MED ORDER — HYDROMORPHONE HCL 1 MG/ML IJ SOLN
0.2500 mg | INTRAMUSCULAR | Status: DC | PRN
  Administered 2024-05-24: 0.5 mg via INTRAVENOUS
  Administered 2024-05-24 (×2): 0.25 mg via INTRAVENOUS

## 2024-05-24 MED ORDER — DEXAMETHASONE SOD PHOSPHATE PF 10 MG/ML IJ SOLN
INTRAMUSCULAR | Status: DC | PRN
  Administered 2024-05-24: 5 mg via INTRAVENOUS

## 2024-05-24 MED ORDER — FENTANYL CITRATE (PF) 100 MCG/2ML IJ SOLN
INTRAMUSCULAR | Status: AC
  Filled 2024-05-24: qty 2

## 2024-05-24 MED ORDER — METHOCARBAMOL 500 MG PO TABS
500.0000 mg | ORAL_TABLET | Freq: Four times a day (QID) | ORAL | Status: DC | PRN
  Administered 2024-05-24 (×2): 500 mg via ORAL
  Filled 2024-05-24 (×2): qty 1

## 2024-05-24 MED ORDER — ALUM & MAG HYDROXIDE-SIMETH 200-200-20 MG/5ML PO SUSP
30.0000 mL | ORAL | Status: DC | PRN
Start: 2024-05-24 — End: 2024-05-25

## 2024-05-24 MED ORDER — LACTATED RINGERS IV SOLN: INTRAVENOUS | Status: DC

## 2024-05-24 MED ORDER — SIMVASTATIN 40 MG PO TABS
40.0000 mg | ORAL_TABLET | Freq: Every day | ORAL | Status: DC
  Administered 2024-05-25: 40 mg via ORAL
  Filled 2024-05-24: qty 1

## 2024-05-24 MED ORDER — HYDROMORPHONE HCL 1 MG/ML IJ SOLN: 0.5000 mg | INTRAMUSCULAR | Status: DC | PRN

## 2024-05-24 MED ORDER — SODIUM CHLORIDE 0.9 % IV SOLN: INTRAVENOUS | Status: DC

## 2024-05-24 MED ORDER — METOCLOPRAMIDE HCL 5 MG PO TABS: 5.0000 mg | ORAL_TABLET | Freq: Three times a day (TID) | ORAL | Status: DC | PRN

## 2024-05-24 MED ORDER — PHENYLEPHRINE HCL-NACL 20-0.9 MG/250ML-% IV SOLN
INTRAVENOUS | Status: DC | PRN
  Administered 2024-05-24: 40 ug/min via INTRAVENOUS

## 2024-05-24 MED ORDER — ONDANSETRON HCL 4 MG/2ML IJ SOLN: 4.0000 mg | Freq: Four times a day (QID) | INTRAMUSCULAR | Status: DC | PRN

## 2024-05-24 MED ORDER — ONDANSETRON HCL 4 MG/2ML IJ SOLN
INTRAMUSCULAR | Status: AC
  Filled 2024-05-24: qty 2

## 2024-05-24 MED ORDER — TRANEXAMIC ACID-NACL 1000-0.7 MG/100ML-% IV SOLN
1000.0000 mg | INTRAVENOUS | Status: AC
  Administered 2024-05-24: 1000 mg via INTRAVENOUS
  Filled 2024-05-24: qty 100

## 2024-05-24 MED ORDER — PROPOFOL 10 MG/ML IV BOLUS
INTRAVENOUS | Status: DC | PRN
  Administered 2024-05-24: 120 mg via INTRAVENOUS

## 2024-05-24 MED ORDER — ORAL CARE MOUTH RINSE: 15.0000 mL | Freq: Once | OROMUCOSAL | Status: AC

## 2024-05-24 MED ORDER — ACETAMINOPHEN 500 MG PO TABS: 1000.0000 mg | ORAL_TABLET | Freq: Once | ORAL | Status: DC

## 2024-05-24 MED ORDER — PROPOFOL 1000 MG/100ML IV EMUL
INTRAVENOUS | Status: AC
  Filled 2024-05-24: qty 100

## 2024-05-24 MED ORDER — PANTOPRAZOLE SODIUM 40 MG PO TBEC
40.0000 mg | DELAYED_RELEASE_TABLET | Freq: Every day | ORAL | Status: DC
  Administered 2024-05-25: 40 mg via ORAL
  Filled 2024-05-24: qty 1

## 2024-05-24 MED ORDER — OXYCODONE HCL 5 MG PO TABS: 10.0000 mg | ORAL_TABLET | ORAL | Status: DC | PRN

## 2024-05-24 MED ORDER — CHLORHEXIDINE GLUCONATE 0.12 % MT SOLN
15.0000 mL | Freq: Once | OROMUCOSAL | Status: AC
  Administered 2024-05-24: 15 mL via OROMUCOSAL

## 2024-05-24 MED ORDER — ONDANSETRON HCL 4 MG/2ML IJ SOLN
INTRAMUSCULAR | Status: DC | PRN
  Administered 2024-05-24: 4 mg via INTRAVENOUS

## 2024-05-24 MED ORDER — METHOCARBAMOL 1000 MG/10ML IJ SOLN
500.0000 mg | Freq: Four times a day (QID) | INTRAMUSCULAR | Status: DC | PRN
Start: 2024-05-24 — End: 2024-05-25

## 2024-05-24 MED ORDER — PHENOL 1.4 % MT LIQD: 1.0000 | OROMUCOSAL | Status: DC | PRN

## 2024-05-24 MED ORDER — MENTHOL 3 MG MT LOZG: 1.0000 | LOZENGE | OROMUCOSAL | Status: DC | PRN

## 2024-05-24 MED ORDER — CHLORHEXIDINE GLUCONATE 4 % EX SOLN: 1.0000 | CUTANEOUS | 1 refills | Status: AC

## 2024-05-24 MED ORDER — ROCURONIUM BROMIDE 100 MG/10ML IV SOLN
INTRAVENOUS | Status: DC | PRN
  Administered 2024-05-24: 50 mg via INTRAVENOUS

## 2024-05-24 MED ORDER — STERILE WATER FOR IRRIGATION IR SOLN
Status: DC | PRN
  Administered 2024-05-24: 1000 mL

## 2024-05-24 MED ORDER — COQ10 200 MG PO CAPS: 200.0000 mg | ORAL_CAPSULE | Freq: Every day | ORAL | Status: DC

## 2024-05-24 MED ORDER — POVIDONE-IODINE 10 % EX SWAB
2.0000 | Freq: Once | CUTANEOUS | Status: AC
  Administered 2024-05-24: 2 via TOPICAL

## 2024-05-24 MED ORDER — PROPOFOL 500 MG/50ML IV EMUL
INTRAVENOUS | Status: DC | PRN
  Administered 2024-05-24: 35 ug/kg/min via INTRAVENOUS

## 2024-05-24 MED ORDER — ZINC 50 MG PO TABS: 1.0000 | ORAL_TABLET | Freq: Every day | ORAL | Status: DC

## 2024-05-24 MED ORDER — CEFAZOLIN SODIUM-DEXTROSE 2-4 GM/100ML-% IV SOLN
2.0000 g | Freq: Four times a day (QID) | INTRAVENOUS | Status: AC
  Administered 2024-05-24 (×2): 2 g via INTRAVENOUS
  Filled 2024-05-24 (×2): qty 100

## 2024-05-24 MED ORDER — ACETAMINOPHEN 325 MG PO TABS: 325.0000 mg | ORAL_TABLET | Freq: Four times a day (QID) | ORAL | Status: DC | PRN

## 2024-05-24 MED ORDER — OXYCODONE HCL 5 MG PO TABS
5.0000 mg | ORAL_TABLET | ORAL | Status: DC | PRN
  Administered 2024-05-24: 5 mg via ORAL
  Filled 2024-05-24: qty 1

## 2024-05-24 MED ORDER — OXYCODONE HCL 5 MG PO TABS: 5.0000 mg | ORAL_TABLET | Freq: Once | ORAL | Status: DC | PRN

## 2024-05-24 MED ORDER — ONDANSETRON HCL 4 MG PO TABS: 4.0000 mg | ORAL_TABLET | Freq: Four times a day (QID) | ORAL | Status: DC | PRN

## 2024-05-24 MED ORDER — HYDROMORPHONE HCL 1 MG/ML IJ SOLN
INTRAMUSCULAR | Status: AC
  Filled 2024-05-24: qty 1

## 2024-05-24 MED ORDER — DEXMEDETOMIDINE HCL IN NACL 80 MCG/20ML IV SOLN
INTRAVENOUS | Status: DC | PRN
  Administered 2024-05-24: 10 ug via INTRAVENOUS

## 2024-05-24 MED ORDER — PHENYLEPHRINE 80 MCG/ML (10ML) SYRINGE FOR IV PUSH (FOR BLOOD PRESSURE SUPPORT)
PREFILLED_SYRINGE | INTRAVENOUS | Status: DC | PRN
  Administered 2024-05-24 (×3): 120 ug via INTRAVENOUS

## 2024-05-24 SURGICAL SUPPLY — 36 items
BAG COUNTER SPONGE SURGICOUNT (BAG) ×1 IMPLANT
BAG ZIPLOCK 12X15 (MISCELLANEOUS) ×1 IMPLANT
BENZOIN TINCTURE PRP APPL 2/3 (GAUZE/BANDAGES/DRESSINGS) IMPLANT
BLADE SAW SGTL 18X1.27X75 (BLADE) ×1 IMPLANT
COVER PERINEAL POST (MISCELLANEOUS) ×1 IMPLANT
COVER SURGICAL LIGHT HANDLE (MISCELLANEOUS) ×1 IMPLANT
CUP ACETAB W/GRIPTION 54 (Plate) IMPLANT
DRAPE FOOT SWITCH (DRAPES) ×1 IMPLANT
DRAPE STERI IOBAN 125X83 (DRAPES) ×1 IMPLANT
DRAPE U-SHAPE 47X51 STRL (DRAPES) ×2 IMPLANT
DRSG AQUACEL AG ADV 3.5X10 (GAUZE/BANDAGES/DRESSINGS) ×1 IMPLANT
DURAPREP 26ML APPLICATOR (WOUND CARE) ×1 IMPLANT
ELECT PENCIL ROCKER SW 15FT (MISCELLANEOUS) ×1 IMPLANT
ELECT REM PT RETURN 15FT ADLT (MISCELLANEOUS) ×1 IMPLANT
GAUZE XEROFORM 1X8 LF (GAUZE/BANDAGES/DRESSINGS) IMPLANT
GLOVE BIO SURGEON STRL SZ7.5 (GLOVE) ×1 IMPLANT
GLOVE BIOGEL PI IND STRL 8 (GLOVE) ×2 IMPLANT
GLOVE SURG ORTHO 8.0 STRL STRW (GLOVE) ×1 IMPLANT
GOWN STRL REUS W/ TWL XL LVL3 (GOWN DISPOSABLE) ×2 IMPLANT
HEAD CERAMIC 36 PLUS5 (Hips) IMPLANT
HOLDER FOLEY CATH W/STRAP (MISCELLANEOUS) ×1 IMPLANT
KIT TURNOVER KIT A (KITS) ×1 IMPLANT
LINER NEUTRAL 54X36MM PLUS 4 (Hips) IMPLANT
PACK ANTERIOR HIP CUSTOM (KITS) ×1 IMPLANT
SET HNDPC FAN SPRY TIP SCT (DISPOSABLE) ×1 IMPLANT
STAPLER SKIN PROX 35W (STAPLE) IMPLANT
STEM FEMORAL SZ6 HIGH ACTIS (Stem) IMPLANT
STRIP CLOSURE SKIN 1/2X4 (GAUZE/BANDAGES/DRESSINGS) IMPLANT
SUT ETHIBOND NAB CT1 #1 30IN (SUTURE) ×1 IMPLANT
SUT ETHILON 2 0 PS N (SUTURE) IMPLANT
SUT MNCRL AB 4-0 PS2 18 (SUTURE) IMPLANT
SUT VIC AB 0 CT1 36 (SUTURE) ×1 IMPLANT
SUT VIC AB 1 CT1 36 (SUTURE) ×1 IMPLANT
SUT VIC AB 2-0 CT1 TAPERPNT 27 (SUTURE) ×2 IMPLANT
TRAY FOLEY MTR SLVR 16FR STAT (SET/KITS/TRAYS/PACK) ×1 IMPLANT
YANKAUER SUCT BULB TIP NO VENT (SUCTIONS) ×1 IMPLANT

## 2024-05-24 NOTE — Op Note (Signed)
 Operative Note  Date of operation: 05/24/2024 Preoperative diagnosis: Left hip primary osteoarthritis Postoperative diagnosis: Same  Procedure: Left direct anterior total hip arthroplasty  Implants: Implant Name Type Inv. Item Serial No. Manufacturer Lot No. LRB No. Used Action  CUP ACETAB W/GRIPTION 54 - [id number] Plate CUP ACETAB W/GRIPTION 54  [employer]PU[employer] [id number] Left 1 Implanted  LINER NEUTRAL 54X36MM PLUS 4 - [id number] Hips LINER NEUTRAL 54X36MM PLUS 4  DEPUY ORTHOPA[employer] [id number] Left 1 Implanted  STEM FEMORAL SZ6 HIGH ACTIS - [id number] Stem STEM FEMORAL SZ6 HIGH ACTIS  DEPUY ORTHOPAEDICS [id number] Left 1 Implanted  HEAD CERAMIC 36 PLUS5 - [id number] Hips HEAD CERAMIC 36 PLUS5  DEPU[employer] ORTH[employer] [id number] Left 1 Implanted   Surgeon: Lonni GRADE. Vernetta, MD Assistant: Tory Gaskins, PA-C  Anesthesia: #1 attempted spinal, #2 General EBL: 325 cc Antibiotics: IV Ancef  Complications: None  Indications: The patient is an active 73 year old gentleman with debilitating arthritis involving his left hip.  This has been well-documented clinical exam and x-ray findings.  At this point his left hip pain is daily and it is detrimentally affecting his mobility, his quality of life and his actives daily living to the point he does wish to proceed with a left total hip arthroplasty and we agree with this as well.  We did discuss in length in detail the risks of acute blood loss anemia, nerve or vessel injury, fracture, infection, DVT, dislocation, implant failure, leg length differences and wound healing issues.  He understands that our goals are hopefully decreased pain, improved mobility and improved quality of life.  Procedure description: After informed consent was obtained and the appropriate left hip was marked, the patient is brought to the operating room and set up on the stretcher where spinal anesthesia was attempted but not successful.  He was then laid in supine position on  stretcher and general anesthesia was obtained.  Traction boots were placed on both his feet and next he was placed supine on the Hana fracture table with a perineal post in place and both legs in inline skeletal traction devices and no traction applied.  His left operative hip and pelvis were assessed radiographically.  The left hip was prepped and draped with DuraPrep and sterile drapes.  A timeout was called and he was identified as the correct patient and correct left hip.  An incision was then made just inferior and posterior to the ASIS and carried slightly obliquely down the leg.  Dissection was carried down to the tensor fascia lata muscle and the tensor fascia was then divided longitudinally to proceed with a direct intra approach the hip.  Circumflex vessels were identified and cauterized.  The hip capsule was identified and opened up in L-type format finding a significant joint effusion.  Cobra retractor placed around the medial and lateral femoral neck and a femoral neck cut was made with oscillating saw just proximal to the lesser trochanter and this cut was completed with an osteotome.  A corkscrew gauze placed in the femoral head and the femoral head was removed in its entirety and there was a wide area devoid of cartilage.  A bent Hohmann was then placed over the medial acetabular rim and remanence of the acetabular labrum and other debris removed.  Reaming was then initiated from a size 43 reamer and stepwise increments up to a size 53 reamer with all reamers placed under direct visualization and the last reamer also placed under direct fluoroscopy in order to obtain the depth of reaming, the inclination  and anteversion.  The real DePuy sector GRIPTION acetabular component was then placed without difficulty followed by a 36+4 polythene liner.  Attention was then turned to the femur.  With the left leg externally rotated to 120 degrees, extended and adducted, and a Mueller retractor was placed  medially and a Hohmann tractor behind the greater trochanter.  The lateral joint capsule was released and a box cutting osteotome was used into the femoral canal.  Broaching was then initiated using the Actis broaching system going from a size 0 going up to a size 6.  With a size 6 in place we trialed a high offset femoral neck based on his anatomy and a 36+1.5 trial head ball.  The left leg was brought over and up and with traction and internal rotation reduced in the pelvis.  Based on radiographic and clinical assessment we felt like we needed a little more leg length.  The hip was dislocated and we removed all trial components.  We then placed the real Actis femoral component with high offset size 6 and with the real 36+5 ceramic head ball.  Again this was reduced in the pelvis and we are pleased with leg length, offset, range of motion and stability assessed radiographically and clinically.  The soft tissue was then irrigated with normal saline solution using pulsatile lavage.  The joint capsule was closed with interrupted #1 Ethibond suture followed by #1 Vicryl to close the tensor fascia.  0 Vicryl is used to close the deep tissue and 2-0 Vicryl was used to close subcutaneous tissue.  The skin was closed with staples.  An Aquacel dressing was applied.  The patient was taken off the Hana table, awakened, extubated and taken recovery room.  Tory Gaskins, PA-C did assist in the entire case and beginning in and his assistance was crucial and medically necessary for soft tissue management and retraction, helping guide implant placement and a layered closure of the wound.

## 2024-05-24 NOTE — TOC Transition Note (Signed)
 Transition of Care [facility]) - Discharge Note   Patient Details  Name: Jacob Cox MRN: [id number] Date of Birth: Mar 29, 1951  [facility] CM/SW Contact:  NORMAN ASPEN, LCSW Phone Number: 05/24/2024, 2:56 PM   Clinical Narrative:     Met with pt who confirms need for RW and no DME agency preferences.  Order placed with [employer] for delivery to room prior to dc.  Pt aware HHPT prearranged with [facility] via ortho MD office.  No further IP CM needs.  Final next level of care: Home w Home Health Services Barriers to Discharge: No Barriers Identified   Patient Goals and CMS Choice Patient states their goals for this hospitalization and ongoing recovery are:: return home          Discharge Placement                       Discharge Plan and Services Additional resources added to the After Visit Summary for                  DME Arranged: Walker rolling DME Agency: [employer] Date DME Agency Contacted: 05/24/24 Time DME Agency Contacted: [phone] Representative spoke with at DME Agency: D[facility] HH Arranged: PT HH Agency: [facility]        Social Drivers of Health (SDOH) Interventions SDOH Screenings   Tobacco Use: High Risk (05/24/2024)     Readmission Risk Interventions     No data to display

## 2024-05-24 NOTE — Anesthesia Postprocedure Evaluation (Signed)
 Anesthesia Post Note  Patient: Jacob Cox  Procedure(s) Performed: ARTHROPLASTY, HIP, TOTAL, ANTERIOR APPROACH (Left: Hip)     Patient location during evaluation: PACU Anesthesia Type: General Level of consciousness: awake and alert Pain management: pain level controlled Vital Signs Assessment: post-procedure vital signs reviewed and stable Respiratory status: spontaneous breathing, nonlabored ventilation, respiratory function stable and patient connected to nasal cannula oxygen  Cardiovascular status: blood pressure returned to baseline and stable Postop Assessment: no apparent nausea or vomiting Anesthetic complications: no   No notable events documented.  Last Vitals:  Vitals:   05/24/24 1115 05/24/24 1215  BP: (!) 134/90 138/87  Pulse: 78 90  Resp: 12 13  Temp:    SpO2: 97% 98%    Last Pain:  Vitals:   05/24/24 1215  TempSrc:   PainSc: Asleep                 [name]

## 2024-05-24 NOTE — Interval H&P Note (Signed)
 History and Physical Interval Note: The patient understands that he is here today for a left total hip replacement to treat his significant left hip pain and arthritis.  There has been no acute or interval change in his medical status.  The risks and benefits of surgery have been discussed in detail and informed consent has been obtained.  The left operative hip has been marked.  05/24/2024 6:56 AM  Jacob Cox  has presented today for surgery, with the diagnosis of Osteoarthritis Left Hip.  The various methods of treatment have been discussed with the patient and family. After consideration of risks, benefits and other options for treatment, the patient has consented to  Procedure(s): ARTHROPLASTY, HIP, TOTAL, ANTERIOR APPROACH (Left) as a surgical intervention.  The patient's history has been reviewed, patient examined, no change in status, stable for surgery.  I have reviewed the patient's chart and labs.  Questions were answered to the patient's satisfaction.     Jacob Cox

## 2024-05-24 NOTE — Evaluation (Signed)
 Physical Therapy Evaluation Patient Details Name: Jacob Cox MRN: [id number] DOB: [birth year] Today's Date: 05/24/2024  History of Present Illness  Pt s/p L THR  Clinical Impression  Pt s/p L THR and presents with decreased L LE strength/ROM and post op pain limiting functional mobility.  Pt should progress to dc home with family assist.        If plan is discharge home, recommend the following: A little help with walking and/or transfers;A little help with bathing/dressing/bathroom;Assistance with cooking/housework;Assist for transportation;Help with stairs or ramp for entrance   Can travel by private vehicle        Equipment Recommendations Rolling walker (2 wheels)  Recommendations for Other Services       Functional Status Assessment Patient has had a recent decline in their functional status and demonstrates the ability to make significant improvements in function in a reasonable and predictable amount of time.     Precautions / Restrictions Precautions Precautions: Fall Restrictions Weight Bearing Restrictions Per Provider Order: Yes LLE Weight Bearing Per Provider Order: Weight bearing as tolerated      Mobility  Bed Mobility Overal bed mobility: Needs Assistance Bed Mobility: Supine to Sit     Supine to sit: Min assist     General bed mobility comments: cues for sequence and use of R LE to self assist    Transfers Overall transfer level: Needs assistance Equipment used: Rolling walker (2 wheels) Transfers: Sit to/from Stand Sit to Stand: Min assist           General transfer comment: cues for LE management and use of UEs to self assist    Ambulation/Gait Ambulation/Gait assistance: Min assist, Contact guard assist Gait Distance (Feet): 95 Feet Assistive device: Rolling walker (2 wheels) Gait Pattern/deviations: Step-to pattern, Step-through pattern, Shuffle, Trunk flexed Gait velocity: decr     General Gait Details: cues for posture,  position from RW and initial sequence  Stairs            Wheelchair Mobility     Tilt Bed    Modified Rankin (Stroke Patients Only)       Balance Overall balance assessment: Needs assistance Sitting-balance support: No upper extremity supported, Feet supported Sitting balance-Leahy Scale: Good     Standing balance support: Bilateral upper extremity supported Standing balance-Leahy Scale: Poor                               Pertinent Vitals/Pain Pain Assessment Pain Assessment: 0-10 Pain Score: 4  Pain Location: L hip Pain Descriptors / Indicators: Aching, Sore Pain Intervention(s): Limited activity within patient's tolerance, Monitored during session, Premedicated before session, Ice applied    Home Living Family/patient expects to be discharged to:: Private residence Living Arrangements: Spouse/significant other Available Help at Discharge: Family;Available 24 hours/day Type of Home: House Home Access: Stairs to enter   [employer] of Steps: 1   Home Layout: One level Home Equipment: None      Prior Function Prior Level of Function : Independent/Modified Independent                     Extremity/Trunk Assessment   Upper Extremity Assessment Upper Extremity Assessment: Overall WFL for tasks assessed    Lower Extremity Assessment Lower Extremity Assessment: LLE deficits/detail    Cervical / Trunk Assessment Cervical / Trunk Assessment: Normal  Communication   Communication Communication: No apparent difficulties    Cognition Arousal: Alert  Behavior During Therapy: WFL for tasks assessed/performed   PT - Cognitive impairments: No apparent impairments                         Following commands: Intact       Cueing Cueing Techniques: Verbal cues     General Comments      Exercises Total Joint Exercises Ankle Circles/Pumps: AROM, Both, 15 reps, Supine   Assessment/Plan    PT Assessment  Patient needs continued PT services  PT Problem List Decreased strength;Decreased range of motion;Decreased activity tolerance;Decreased balance;Decreased mobility;Decreased knowledge of use of DME;Pain       PT Treatment Interventions DME instruction;Gait training;Stair training;Functional mobility training;Therapeutic activities;Therapeutic exercise;Patient/family education    PT Goals (Current goals can be found in the Care Plan section)  Acute Rehab PT Goals Patient Stated Goal: Regain IND PT Goal Formulation: With patient Time For Goal Achievement: 05/31/24 Potential to Achieve Goals: Good    Frequency 7X/week     Co-evaluation               AM-PAC PT 6 Clicks Mobility  Outcome Measure Help needed turning from your back to your side while in a flat bed without using bedrails?: A Little Help needed moving from lying on your back to sitting on the side of a flat bed without using bedrails?: A Little Help needed moving to and from a bed to a chair (including a wheelchair)?: A Little Help needed standing up from a chair using your arms (e.g., wheelchair or bedside chair)?: A Little Help needed to walk in hospital room?: A Little Help needed climbing 3-5 steps with a railing? : A Lot 6 Click Score: 17    End of Session Equipment Utilized During Treatment: Gait belt Activity Tolerance: Patient tolerated treatment well Patient left: in chair;with call bell/phone within reach;with chair alarm set;with family/visitor present Nurse Communication: Mobility status PT Visit Diagnosis: Difficulty in walking, not elsewhere classified (R26.2)    Time: 8494-8473 PT Time Calculation (min) (ACUTE ONLY): 21 min   Charges:   PT Evaluation $PT Eval Low Complexity: 1 Low   PT General Charges $$ ACUTE PT VISIT: 1 Visit         [facility] PT Acute Rehabilitation Services Office [phone]   [facility] 05/24/2024, 3:54 PM

## 2024-05-24 NOTE — Transfer of Care (Signed)
 Immediate Anesthesia Transfer of Care Note  Patient: Jacob Cox  Procedure(s) Performed: ARTHROPLASTY, HIP, TOTAL, ANTERIOR APPROACH (Left: Hip)  Patient Location: PACU  Anesthesia Type:General  Level of Consciousness: drowsy  Airway & Oxygen  Therapy: Patient Spontanous Breathing and Patient connected to face mask oxygen   Post-op Assessment: Report given to RN and Post -op Vital signs reviewed and stable  Post vital signs: Reviewed and stable  Last Vitals:  Vitals Value Taken Time  BP 117/83 05/24/24 08:59  Temp    Pulse 82 05/24/24 09:00  Resp 14 05/24/24 09:00  SpO2 100 % 05/24/24 09:00  Vitals shown include unfiled device data.  Last Pain:  Vitals:   05/24/24 0551  TempSrc:   PainSc: 0-No pain         Complications: No notable events documented.

## 2024-05-24 NOTE — Anesthesia Procedure Notes (Signed)
 Procedure Name: Intubation Date/Time: 05/24/2024 7:35 AM  Performed by: Augusta Daved SAILOR, CRNAPre-anesthesia Checklist: Patient identified, Emergency Drugs available, Suction available and Patient being monitored Patient Re-evaluated:Patient Re-evaluated prior to induction Oxygen  Delivery Method: Circle system utilized Preoxygenation: Pre-oxygenation with 100% oxygen  Induction Type: IV induction Ventilation: Mask ventilation without difficulty and Oral airway inserted - appropriate to patient size Laryngoscope Size: Cleotilde and 2 Grade View: Grade II Tube type: Oral Tube size: 7.5 mm Number of attempts: 1 Airway Equipment and Method: Stylet and Oral airway Placement Confirmation: ETT inserted through vocal cords under direct vision, positive ETCO2 and breath sounds checked- equal and bilateral Secured at: 22 cm Tube secured with: Tape Dental Injury: Teeth and Oropharynx as per pre-operative assessment

## 2024-05-24 NOTE — Plan of Care (Signed)
  Problem: Education: Goal: Knowledge of General Education information will improve Description: Including pain rating scale, medication(s)/side effects and non-pharmacologic comfort measures Outcome: Adequate for Discharge   Problem: Health Behavior/Discharge Planning: Goal: Ability to manage health-related needs will improve Outcome: Adequate for Discharge   Problem: Clinical Measurements: Goal: Ability to maintain clinical measurements within normal limits will improve Outcome: Progressing Goal: Will remain free from infection Outcome: Progressing Goal: Diagnostic test results will improve Outcome: Progressing Goal: Respiratory complications will improve Outcome: Progressing Goal: Cardiovascular complication will be avoided Outcome: Progressing   Problem: Activity: Goal: Risk for activity intolerance will decrease Outcome: Adequate for Discharge   Problem: Nutrition: Goal: Adequate nutrition will be maintained Outcome: Completed/Met   Problem: Coping: Goal: Level of anxiety will decrease Outcome: Progressing   Problem: Elimination: Goal: Will not experience complications related to bowel motility Outcome: Progressing Goal: Will not experience complications related to urinary retention Outcome: Completed/Met   Problem: Pain Managment: Goal: General experience of comfort will improve and/or be controlled Outcome: Progressing   Problem: Safety: Goal: Ability to remain free from injury will improve Outcome: Progressing   Problem: Skin Integrity: Goal: Risk for impaired skin integrity will decrease Outcome: Adequate for Discharge   Problem: Education: Goal: Knowledge of the prescribed therapeutic regimen will improve Outcome: Progressing Goal: Understanding of discharge needs will improve Outcome: Progressing Goal: Individualized Educational Video(s) Outcome: Completed/Met   Problem: Activity: Goal: Ability to avoid complications of mobility impairment will  improve Outcome: Adequate for Discharge Goal: Ability to tolerate increased activity will improve Outcome: Adequate for Discharge   Problem: Clinical Measurements: Goal: Postoperative complications will be avoided or minimized Outcome: Progressing   Problem: Pain Management: Goal: Pain level will decrease with appropriate interventions Outcome: Adequate for Discharge   Problem: Skin Integrity: Goal: Will show signs of wound healing Outcome: Progressing

## 2024-05-24 NOTE — Anesthesia Preprocedure Evaluation (Addendum)
 Anesthesia Evaluation  Patient identified by MRN, date of birth, ID band Patient awake    Reviewed: Allergy & Precautions, NPO status , Patient's Chart, lab work & pertinent test results  Airway Mallampati: I  TM Distance: >3 FB Neck ROM: Full    Dental  (+) Teeth Intact, Dental Advisory Given   Pulmonary Current Smoker and Patient abstained from smoking.   breath sounds clear to auscultation       Cardiovascular negative cardio ROS  Rhythm:Regular Rate:Normal     Neuro/Psych   Anxiety     negative neurological ROS     GI/Hepatic Neg liver ROS,GERD  ,,  Endo/Other  negative endocrine ROS    Renal/GU negative Renal ROS     Musculoskeletal  (+) Arthritis ,    Abdominal   Peds  Hematology negative hematology ROS (+)   Anesthesia Other Findings   Reproductive/Obstetrics                              Anesthesia Physical Anesthesia Plan  ASA: 2  Anesthesia Plan: Spinal   Post-op Pain Management: Tylenol  PO (pre-op)*   Induction: Intravenous  PONV Risk Score and Plan: 1 and Ondansetron  and Propofol  infusion  Airway Management Planned: Natural Airway and Nasal Cannula  Additional Equipment: None  Intra-op Plan:   Post-operative Plan:   Informed Consent: I have reviewed the patients History and Physical, chart, labs and discussed the procedure including the risks, benefits and alternatives for the proposed anesthesia with the patient or authorized representative who has indicated his/her understanding and acceptance.     Dental advisory given  Plan Discussed with: CRNA  Anesthesia Plan Comments: (Lab Results      Component                Value               Date                      WBC                      9.1                 05/15/2024                HGB                      13.6                05/15/2024                HCT                      40.6                05/15/2024                 MCV                      98.8                05/15/2024                PLT                      198  05/15/2024           )         Anesthesia Quick Evaluation

## 2024-05-25 DIAGNOSIS — M1612 Unilateral primary osteoarthritis, left hip: Secondary | ICD-10-CM | POA: Diagnosis not present

## 2024-05-25 LAB — CBC
HCT: 35.1 % — ABNORMAL LOW (ref 39.0–52.0)
Hemoglobin: 11.6 g/dL — ABNORMAL LOW (ref 13.0–17.0)
MCH: 33 pg (ref 26.0–34.0)
MCHC: 33 g/dL (ref 30.0–36.0)
MCV: 100 fL (ref 80.0–100.0)
Platelets: 171 K/uL (ref 150–400)
RBC: 3.51 MIL/uL — ABNORMAL LOW (ref 4.22–5.81)
RDW: 12.5 % (ref 11.5–15.5)
WBC: 16.4 K/uL — ABNORMAL HIGH (ref 4.0–10.5)
nRBC: 0 % (ref 0.0–0.2)

## 2024-05-25 LAB — BASIC METABOLIC PANEL WITH GFR
Anion gap: 10 (ref 5–15)
BUN: 20 mg/dL (ref 8–23)
CO2: 24 mmol/L (ref 22–32)
Calcium: 9.2 mg/dL (ref 8.9–10.3)
Chloride: 109 mmol/L (ref 98–111)
Creatinine, Ser: 1.37 mg/dL — ABNORMAL HIGH (ref 0.61–1.24)
GFR, Estimated: 55 mL/min — ABNORMAL LOW (ref >60)
Glucose, Bld: 139 mg/dL — ABNORMAL HIGH (ref 70–99)
Potassium: 5.1 mmol/L (ref 3.5–5.1)
Sodium: 142 mmol/L (ref 135–145)

## 2024-05-25 MED ORDER — TIZANIDINE HCL 2 MG PO TABS: 2.0000 mg | ORAL_TABLET | Freq: Four times a day (QID) | ORAL | 1 refills | Status: AC | PRN

## 2024-05-25 MED ORDER — OXYCODONE HCL 5 MG PO TABS: 5.0000 mg | ORAL_TABLET | Freq: Four times a day (QID) | ORAL | 0 refills | Status: AC | PRN

## 2024-05-25 NOTE — Care Management Obs Status (Signed)
 MEDICARE OBSERVATION STATUS NOTIFICATION   Patient Details  Name: SHAMON COTHRAN MRN: [id number] Date of Birth: October 28, 1950   Medicare Observation Status Notification Given:  Yes    Sonda Manuella Quill, RN 05/25/2024, 9:18 AM

## 2024-05-25 NOTE — Discharge Summary (Signed)
 Patient ID: Jacob Cox MRN: [id number] DOB/AGE: 72/05/1951 72 y.o.  Admit date: 05/24/2024 Discharge date: 05/25/2024  Admission Diagnoses:  Principal Problem:   Unilateral primary osteoarthritis, left hip Active Problems:   Status post total replacement of left hip   Discharge Diagnoses:  Same  Past Medical History:  Diagnosis Date   Arthritis    Cancer (HCC)    stage 0 basal and melenoma   Complication of anesthesia    SLow to wake up cardiac cath   GERD (gastroesophageal reflux disease)    Gout    Hyperlipidemia    Panic attacks    Pneumonia     Surgeries: Procedure(s): ARTHROPLASTY, HIP, TOTAL, ANTERIOR APPROACH on 05/24/2024   Consultants:   Discharged Condition: Improved  Hospital Course: TYRECK BELL is an 73 y.o. male who was admitted 05/24/2024 for operative treatment ofUnilateral primary osteoarthritis, left hip. Patient has severe unremitting pain that affects sleep, daily activities, and work/hobbies. After pre-op clearance the patient was taken to the operating room on 05/24/2024 and underwent  Procedure(s): ARTHROPLASTY, HIP, TOTAL, ANTERIOR APPROACH.    Patient was given perioperative antibiotics:  Anti-infectives (From admission, onward)    Start     Dose/Rate Route Frequency Ordered Stop   05/24/24 1400  ceFAZolin  (ANCEF ) IVPB 2g/100 mL premix        2 g 200 mL/hr over 30 Minutes Intravenous Every 6 hours 05/24/24 1247 05/24/24 2100   05/24/24 0600  ceFAZolin  (ANCEF ) IVPB 2g/100 mL premix        2 g 200 mL/hr over 30 Minutes Intravenous On call to O.R. 05/24/24 9470 05/24/24 0746        Patient was given sequential compression devices, early ambulation, and chemoprophylaxis to prevent DVT.  Inpatient Morphine Milligram Equivalents Per Day 11/21 - 11/22   Values displayed are in units of MME/Day    Order Start / End Date Yesterday Today    oxyCODONE  (Oxy IR/ROXICODONE ) immediate release tablet 5 mg 11/21 - 11/21 0 of Unknown --     oxyCODONE  (ROXICODONE ) 5 MG/5ML solution 5 mg 11/21 - 11/21 0 of Unknown --      Group total: 0 of Unknown     HYDROmorphone  (DILAUDID ) injection 0.25-0.5 mg 11/21 - 11/21 20 of 40-80 --    fentaNYL  (SUBLIMAZE ) injection 11/21 - 11/21 *60 of 60 --    Daily Totals  * 80 of Unknown (at least 100-140) --  *One-Step medication  Calculation Errors     Order Type Date Details   oxyCODONE  (Oxy IR/ROXICODONE ) immediate release tablet 5 mg Ordered Dose -- Insufficient frequency information   oxyCODONE  (ROXICODONE ) 5 MG/5ML solution 5 mg Ordered Dose -- Insufficient frequency information            Patient benefited maximally from hospital stay and there were no complications.    Recent vital signs: Patient Vitals for the past 24 hrs:  BP Temp Temp src Pulse Resp SpO2 Height Weight  05/25/24 0522 118/87 98.7 F (37.1 C) -- 94 15 98 % -- --  05/25/24 0111 (!) 132/93 98.7 F (37.1 C) Oral (!) 104 15 97 % -- --  05/24/24 2115 (!) 159/98 98 F (36.7 C) Oral (!) 108 15 98 % -- --  05/24/24 1748 -- -- -- -- -- -- 5' 7.99 (1.727 m) 87 kg  05/24/24 1557 (!) 153/109 98.8 F (37.1 C) -- (!) 110 16 98 % -- --     Recent laboratory studies:  Recent Labs  05/24/24 0557 05/25/24 0322  WBC  --  16.4*  HGB  --  11.6*  HCT  --  35.1*  PLT  --  171  NA 140 142  K 3.7 5.1  CL 106 109  CO2 22 24  BUN 20 20  CREATININE 1.23 1.37*  GLUCOSE 119* 139*  CALCIUM 10.0 9.2     Discharge Medications:   Allergies as of 05/25/2024       Reactions   Sulfonamide Derivatives    REACTION: hives        Medication List     TAKE these medications    allopurinol  100 MG tablet Commonly known as: ZYLOPRIM  Take 200 mg by mouth in the morning.   aspirin  EC 81 MG tablet Take 81 mg by mouth daily.   chlorhexidine  4 % external liquid Commonly known as: HIBICLENS  Apply 15 mLs (1 Application total) topically as directed for 30 doses. Use as directed daily for 5 days every other week for 6  weeks.   CoQ10 200 MG Caps Take 200 mg by mouth daily at 12 noon.   FISH OIL PO Take 1,560 mg by mouth daily at 12 noon. 1 teaspoon daily   Iron 325 (65 Fe) MG Tabs Take 2 tablets by mouth daily at 12 noon.   mupirocin  ointment 2 % Commonly known as: BACTROBAN  Place 1 Application into the nose 2 (two) times daily for 60 doses. Use as directed 2 times daily for 5 days every other week for 6 weeks.   oxyCODONE  5 MG immediate release tablet Commonly known as: Oxy IR/ROXICODONE  Take 1-2 tablets (5-10 mg total) by mouth every 6 (six) hours as needed for moderate pain (pain score 4-6) (pain score 4-6).   simvastatin  40 MG tablet Commonly known as: ZOCOR  Take 40 mg by mouth daily.   tiZANidine  2 MG tablet Commonly known as: ZANAFLEX  Take 1 tablet (2 mg total) by mouth every 6 (six) hours as needed for muscle spasms.   UNABLE TO FIND Chiroklenz tea with psyllium husk every pm   vitamin C  1000 MG tablet Take 1,000 mg by mouth daily.   Zinc  50 MG Tabs Take 1 tablet by mouth daily at 12 noon.               Durable Medical Equipment  (From admission, onward)           Start     Ordered   05/24/24 1247  DME 3 n 1  Once        05/24/24 1246   05/24/24 1247  DME Walker rolling  Once       Question Answer Comment  Walker: With 5 Inch Wheels   Patient needs a walker to treat with the following condition Status post total replacement of left hip      05/24/24 1246            Diagnostic Studies: DG Pelvis Portable Result Date: 05/24/2024 CLINICAL DATA:  Postop. EXAM: PORTABLE PELVIS 1-2 VIEWS COMPARISON:  None Available. FINDINGS: Left hip arthroplasty in expected alignment. No periprosthetic lucency or fracture. Recent postsurgical change includes air and edema in the soft tissues. Overlying skin staples in place. IMPRESSION: Left hip arthroplasty without immediate postoperative complication. Electronically Signed   By: Andrea Gasman M.D.   On: 05/24/2024 11:24    DG HIP UNILAT WITH PELVIS 1V LEFT Result Date: 05/24/2024 CLINICAL DATA:  Elective surgery. EXAM: DG HIP (WITH OR WITHOUT PELVIS) 1V*L* COMPARISON:  02/28/2024 FINDINGS: Three  fluoroscopic spot views of the pelvis and left hip obtained in the operating room. Images during hip arthroplasty. Fluoroscopy time 14 seconds. Dose 1.8690 mg. IMPRESSION: Intraoperative fluoroscopy during left hip arthroplasty. Electronically Signed   By: Andrea Gasman M.D.   On: 05/24/2024 11:23   DG C-Arm 1-60 Min-No Report Result Date: 05/24/2024 Fluoroscopy was utilized by the requesting physician.  No radiographic interpretation.    Disposition: Discharge disposition: 01-Home or Self Care          Follow-up Information     Vernetta Lonni GRADE, MD. Go on 06/06/2024.   Specialty: Orthopedic Surgery Why: at 1:30 pm for your first in office post operative appointment with Dr. Vernetta Pass information: [address] [place] [address] [phone]         [facility] Follow up.   Specialty: Home Health Services Why: to provide home physical therapy visits Contact information: [address] [place] [address] [phone]                  Signed: Lonni GRADE Vernetta 05/25/2024, 1:07 PM

## 2024-05-25 NOTE — Progress Notes (Signed)
 Subjective: 1 Day Post-Op Procedure(s) (LRB): ARTHROPLASTY, HIP, TOTAL, ANTERIOR APPROACH (Left) Patient reports pain as moderate.    Objective: Vital signs in last 24 hours: Temp:  [98 F (36.7 C)-98.9 F (37.2 C)] 98.7 F (37.1 C) (11/22 0522) Pulse Rate:  [78-110] 94 (11/22 0522) Resp:  [12-16] 15 (11/22 0522) BP: (118-159)/(86-109) 118/87 (11/22 0522) SpO2:  [92 %-98 %] 98 % (11/22 0522) Weight:  [87 kg] 87 kg (11/21 1748)  Intake/Output from previous day: 11/21 0701 - 11/22 0700 In: 2977.9 [P.O.:360; I.V.:2217.9; IV Piggyback:400] Out: 1325 [Urine:1000; Blood:325] Intake/Output this shift: No intake/output data recorded.  Recent Labs    05/25/24 0322  HGB 11.6*   Recent Labs    05/25/24 0322  WBC 16.4*  RBC 3.51*  HCT 35.1*  PLT 171   Recent Labs    05/24/24 0557 05/25/24 0322  NA 140 142  K 3.7 5.1  CL 106 109  CO2 22 24  BUN 20 20  CREATININE 1.23 1.37*  GLUCOSE 119* 139*  CALCIUM 10.0 9.2   No results for input(s): LABPT, INR in the last 72 hours.  Sensation intact distally Intact pulses distally Dorsiflexion/Plantar flexion intact Incision: dressing C/D/I   Assessment/Plan: 1 Day Post-Op Procedure(s) (LRB): ARTHROPLASTY, HIP, TOTAL, ANTERIOR APPROACH (Left) Up with therapy Discharge home with home health      Lonni CINDERELLA Poli 05/25/2024, 9:45 AM

## 2024-05-25 NOTE — Progress Notes (Signed)
 Physical Therapy Treatment Patient Details Name: Jacob Cox MRN: [id number] DOB: Mar 23, 1951 Today's Date: 05/25/2024   History of Present Illness Pt s/p L THR    PT Comments  Pt motivated and progressing well with mobility including up to ambulate increased distance in hall, negotiated stairs, reviewed car transfers and HEP initiated.  Pt eager for dc home this date.    If plan is discharge home, recommend the following: A little help with walking and/or transfers;A little help with bathing/dressing/bathroom;Assistance with cooking/housework;Assist for transportation;Help with stairs or ramp for entrance   Can travel by private vehicle        Equipment Recommendations  Rolling walker (2 wheels)    Recommendations for Other Services       Precautions / Restrictions Precautions Precautions: Fall Restrictions Weight Bearing Restrictions Per Provider Order: Yes LLE Weight Bearing Per Provider Order: Weight bearing as tolerated     Mobility  Bed Mobility Overal bed mobility: Needs Assistance Bed Mobility: Supine to Sit     Supine to sit: Supervision          Transfers Overall transfer level: Needs assistance Equipment used: Rolling walker (2 wheels) Transfers: Sit to/from Stand Sit to Stand: Contact guard assist, Supervision           General transfer comment: cues for LE management and use of UEs to self assist    Ambulation/Gait Ambulation/Gait assistance: Contact guard assist, Supervision Gait Distance (Feet): 180 Feet Assistive device: Rolling walker (2 wheels) Gait Pattern/deviations: Step-to pattern, Step-through pattern, Shuffle, Trunk flexed Gait velocity: decr     General Gait Details: min cues for posture, position from RW and initial sequence   Stairs Stairs: Yes Stairs assistance: Contact guard assist Stair Management: No rails, Step to pattern, Forwards, With walker Number of Stairs: 2 General stair comments: single step twice with  cues for sequence and RW management   Wheelchair Mobility     Tilt Bed    Modified Rankin (Stroke Patients Only)       Balance Overall balance assessment: Needs assistance Sitting-balance support: No upper extremity supported, Feet supported Sitting balance-Leahy Scale: Good     Standing balance support: No upper extremity supported Standing balance-Leahy Scale: Fair                              Hotel Manager: No apparent difficulties  Cognition Arousal: Alert Behavior During Therapy: WFL for tasks assessed/performed   PT - Cognitive impairments: No apparent impairments                         Following commands: Intact      Cueing Cueing Techniques: Verbal cues  Exercises Total Joint Exercises Ankle Circles/Pumps: AROM, Both, 15 reps, Supine Quad Sets: AROM, Both, 10 reps, Supine Heel Slides: AAROM, Left, 20 reps, Supine Hip ABduction/ADduction: AAROM, Left, 15 reps, Supine    General Comments        Pertinent Vitals/Pain Pain Assessment Pain Assessment: 0-10 Pain Score: 4  Pain Location: L hip Pain Descriptors / Indicators: Aching, Sore Pain Intervention(s): Limited activity within patient's tolerance, Monitored during session, Ice applied    Home Living                          Prior Function            PT Goals (current goals can now be  found in the care plan section) Acute Rehab PT Goals Patient Stated Goal: Regain IND PT Goal Formulation: With patient Time For Goal Achievement: 05/31/24 Potential to Achieve Goals: Good Progress towards PT goals: Progressing toward goals    Frequency    7X/week      PT Plan      Co-evaluation              AM-PAC PT 6 Clicks Mobility   Outcome Measure  Help needed turning from your back to your side while in a flat bed without using bedrails?: A Little Help needed moving from lying on your back to sitting on the side of a flat  bed without using bedrails?: A Little Help needed moving to and from a bed to a chair (including a wheelchair)?: A Little Help needed standing up from a chair using your arms (e.g., wheelchair or bedside chair)?: A Little Help needed to walk in hospital room?: A Little Help needed climbing 3-5 steps with a railing? : A Little 6 Click Score: 18    End of Session Equipment Utilized During Treatment: Gait belt Activity Tolerance: Patient tolerated treatment well Patient left: in chair;with call bell/phone within reach;with chair alarm set;with family/visitor present Nurse Communication: Mobility status PT Visit Diagnosis: Difficulty in walking, not elsewhere classified (R26.2)     Time: 9097-9065 PT Time Calculation (min) (ACUTE ONLY): 32 min  Charges:    $Gait Training: 8-22 mins $Therapeutic Exercise: 8-22 mins PT General Charges $$ ACUTE PT VISIT: 1 Visit                     [facility] PT Acute Rehabilitation Services Office [phone]    [facility] 05/25/2024, 9:42 AM

## 2024-05-25 NOTE — Discharge Instructions (Signed)

## 2024-05-26 DIAGNOSIS — E785 Hyperlipidemia, unspecified: Secondary | ICD-10-CM | POA: Diagnosis not present

## 2024-05-26 DIAGNOSIS — Z85828 Personal history of other malignant neoplasm of skin: Secondary | ICD-10-CM | POA: Diagnosis not present

## 2024-05-26 DIAGNOSIS — Z471 Aftercare following joint replacement surgery: Secondary | ICD-10-CM | POA: Diagnosis not present

## 2024-05-26 DIAGNOSIS — D649 Anemia, unspecified: Secondary | ICD-10-CM | POA: Diagnosis not present

## 2024-05-26 DIAGNOSIS — Z9842 Cataract extraction status, left eye: Secondary | ICD-10-CM | POA: Diagnosis not present

## 2024-05-26 DIAGNOSIS — Z9181 History of falling: Secondary | ICD-10-CM | POA: Diagnosis not present

## 2024-05-26 DIAGNOSIS — F17210 Nicotine dependence, cigarettes, uncomplicated: Secondary | ICD-10-CM | POA: Diagnosis not present

## 2024-05-26 DIAGNOSIS — Z96642 Presence of left artificial hip joint: Secondary | ICD-10-CM | POA: Diagnosis not present

## 2024-05-26 DIAGNOSIS — Z8701 Personal history of pneumonia (recurrent): Secondary | ICD-10-CM | POA: Diagnosis not present

## 2024-05-26 DIAGNOSIS — Z9841 Cataract extraction status, right eye: Secondary | ICD-10-CM | POA: Diagnosis not present

## 2024-05-26 DIAGNOSIS — Z7982 Long term (current) use of aspirin: Secondary | ICD-10-CM | POA: Diagnosis not present

## 2024-05-26 DIAGNOSIS — K219 Gastro-esophageal reflux disease without esophagitis: Secondary | ICD-10-CM | POA: Diagnosis not present

## 2024-05-26 DIAGNOSIS — M109 Gout, unspecified: Secondary | ICD-10-CM | POA: Diagnosis not present

## 2024-05-27 ENCOUNTER — Encounter (HOSPITAL_COMMUNITY): Payer: Self-pay | Admitting: Orthopaedic Surgery

## 2024-05-28 DIAGNOSIS — Z96642 Presence of left artificial hip joint: Secondary | ICD-10-CM | POA: Diagnosis not present

## 2024-05-28 DIAGNOSIS — Z85828 Personal history of other malignant neoplasm of skin: Secondary | ICD-10-CM | POA: Diagnosis not present

## 2024-05-28 DIAGNOSIS — Z9841 Cataract extraction status, right eye: Secondary | ICD-10-CM | POA: Diagnosis not present

## 2024-05-28 DIAGNOSIS — F17210 Nicotine dependence, cigarettes, uncomplicated: Secondary | ICD-10-CM | POA: Diagnosis not present

## 2024-05-28 DIAGNOSIS — D649 Anemia, unspecified: Secondary | ICD-10-CM | POA: Diagnosis not present

## 2024-05-28 DIAGNOSIS — Z7982 Long term (current) use of aspirin: Secondary | ICD-10-CM | POA: Diagnosis not present

## 2024-05-28 DIAGNOSIS — E785 Hyperlipidemia, unspecified: Secondary | ICD-10-CM | POA: Diagnosis not present

## 2024-05-28 DIAGNOSIS — Z9842 Cataract extraction status, left eye: Secondary | ICD-10-CM | POA: Diagnosis not present

## 2024-05-28 DIAGNOSIS — Z8701 Personal history of pneumonia (recurrent): Secondary | ICD-10-CM | POA: Diagnosis not present

## 2024-05-28 DIAGNOSIS — K219 Gastro-esophageal reflux disease without esophagitis: Secondary | ICD-10-CM | POA: Diagnosis not present

## 2024-05-28 DIAGNOSIS — Z471 Aftercare following joint replacement surgery: Secondary | ICD-10-CM | POA: Diagnosis not present

## 2024-05-28 DIAGNOSIS — Z9181 History of falling: Secondary | ICD-10-CM | POA: Diagnosis not present

## 2024-05-28 DIAGNOSIS — M109 Gout, unspecified: Secondary | ICD-10-CM | POA: Diagnosis not present

## 2024-05-31 DIAGNOSIS — D649 Anemia, unspecified: Secondary | ICD-10-CM | POA: Diagnosis not present

## 2024-05-31 DIAGNOSIS — E785 Hyperlipidemia, unspecified: Secondary | ICD-10-CM | POA: Diagnosis not present

## 2024-05-31 DIAGNOSIS — Z85828 Personal history of other malignant neoplasm of skin: Secondary | ICD-10-CM | POA: Diagnosis not present

## 2024-05-31 DIAGNOSIS — F17210 Nicotine dependence, cigarettes, uncomplicated: Secondary | ICD-10-CM | POA: Diagnosis not present

## 2024-05-31 DIAGNOSIS — Z96642 Presence of left artificial hip joint: Secondary | ICD-10-CM | POA: Diagnosis not present

## 2024-05-31 DIAGNOSIS — M109 Gout, unspecified: Secondary | ICD-10-CM | POA: Diagnosis not present

## 2024-05-31 DIAGNOSIS — K219 Gastro-esophageal reflux disease without esophagitis: Secondary | ICD-10-CM | POA: Diagnosis not present

## 2024-05-31 DIAGNOSIS — Z9842 Cataract extraction status, left eye: Secondary | ICD-10-CM | POA: Diagnosis not present

## 2024-05-31 DIAGNOSIS — Z9841 Cataract extraction status, right eye: Secondary | ICD-10-CM | POA: Diagnosis not present

## 2024-05-31 DIAGNOSIS — Z8701 Personal history of pneumonia (recurrent): Secondary | ICD-10-CM | POA: Diagnosis not present

## 2024-05-31 DIAGNOSIS — Z471 Aftercare following joint replacement surgery: Secondary | ICD-10-CM | POA: Diagnosis not present

## 2024-05-31 DIAGNOSIS — Z9181 History of falling: Secondary | ICD-10-CM | POA: Diagnosis not present

## 2024-05-31 DIAGNOSIS — Z7982 Long term (current) use of aspirin: Secondary | ICD-10-CM | POA: Diagnosis not present

## 2024-06-03 DIAGNOSIS — Z85828 Personal history of other malignant neoplasm of skin: Secondary | ICD-10-CM | POA: Diagnosis not present

## 2024-06-03 DIAGNOSIS — Z9841 Cataract extraction status, right eye: Secondary | ICD-10-CM | POA: Diagnosis not present

## 2024-06-03 DIAGNOSIS — D649 Anemia, unspecified: Secondary | ICD-10-CM | POA: Diagnosis not present

## 2024-06-03 DIAGNOSIS — M109 Gout, unspecified: Secondary | ICD-10-CM | POA: Diagnosis not present

## 2024-06-03 DIAGNOSIS — F17210 Nicotine dependence, cigarettes, uncomplicated: Secondary | ICD-10-CM | POA: Diagnosis not present

## 2024-06-03 DIAGNOSIS — K219 Gastro-esophageal reflux disease without esophagitis: Secondary | ICD-10-CM | POA: Diagnosis not present

## 2024-06-03 DIAGNOSIS — Z8701 Personal history of pneumonia (recurrent): Secondary | ICD-10-CM | POA: Diagnosis not present

## 2024-06-03 DIAGNOSIS — E785 Hyperlipidemia, unspecified: Secondary | ICD-10-CM | POA: Diagnosis not present

## 2024-06-03 DIAGNOSIS — Z7982 Long term (current) use of aspirin: Secondary | ICD-10-CM | POA: Diagnosis not present

## 2024-06-03 DIAGNOSIS — Z9842 Cataract extraction status, left eye: Secondary | ICD-10-CM | POA: Diagnosis not present

## 2024-06-03 DIAGNOSIS — Z96642 Presence of left artificial hip joint: Secondary | ICD-10-CM | POA: Diagnosis not present

## 2024-06-03 DIAGNOSIS — Z471 Aftercare following joint replacement surgery: Secondary | ICD-10-CM | POA: Diagnosis not present

## 2024-06-03 DIAGNOSIS — Z9181 History of falling: Secondary | ICD-10-CM | POA: Diagnosis not present

## 2024-06-05 DIAGNOSIS — Z9181 History of falling: Secondary | ICD-10-CM | POA: Diagnosis not present

## 2024-06-05 DIAGNOSIS — Z9841 Cataract extraction status, right eye: Secondary | ICD-10-CM | POA: Diagnosis not present

## 2024-06-05 DIAGNOSIS — M109 Gout, unspecified: Secondary | ICD-10-CM | POA: Diagnosis not present

## 2024-06-05 DIAGNOSIS — E785 Hyperlipidemia, unspecified: Secondary | ICD-10-CM | POA: Diagnosis not present

## 2024-06-05 DIAGNOSIS — Z471 Aftercare following joint replacement surgery: Secondary | ICD-10-CM | POA: Diagnosis not present

## 2024-06-05 DIAGNOSIS — K219 Gastro-esophageal reflux disease without esophagitis: Secondary | ICD-10-CM | POA: Diagnosis not present

## 2024-06-05 DIAGNOSIS — Z7982 Long term (current) use of aspirin: Secondary | ICD-10-CM | POA: Diagnosis not present

## 2024-06-05 DIAGNOSIS — Z9842 Cataract extraction status, left eye: Secondary | ICD-10-CM | POA: Diagnosis not present

## 2024-06-05 DIAGNOSIS — Z96642 Presence of left artificial hip joint: Secondary | ICD-10-CM | POA: Diagnosis not present

## 2024-06-05 DIAGNOSIS — D649 Anemia, unspecified: Secondary | ICD-10-CM | POA: Diagnosis not present

## 2024-06-05 DIAGNOSIS — F17210 Nicotine dependence, cigarettes, uncomplicated: Secondary | ICD-10-CM | POA: Diagnosis not present

## 2024-06-05 DIAGNOSIS — Z85828 Personal history of other malignant neoplasm of skin: Secondary | ICD-10-CM | POA: Diagnosis not present

## 2024-06-05 DIAGNOSIS — Z8701 Personal history of pneumonia (recurrent): Secondary | ICD-10-CM | POA: Diagnosis not present

## 2024-06-06 ENCOUNTER — Ambulatory Visit: Admitting: Orthopaedic Surgery

## 2024-06-06 DIAGNOSIS — Z96642 Presence of left artificial hip joint: Secondary | ICD-10-CM

## 2024-06-06 NOTE — Progress Notes (Signed)
 The patient is doing very well at 2 weeks status post a left total hip replacement to treat significant left hip pain and arthritis.  He has been compliant with a baby aspirin  twice daily and was on this once daily prior to surgery.  He denies any calf pain and has been wearing compressive socks.  He can stop his baby aspirin .  His left hip incision looks good.  Staples are removed and Steri-Strips applied.  There is moderate swelling but not significant.  His leg lengths fully equal.  He is mobilizing very well.  He is requesting outpatient physical therapy with [facility] and we are fine with that.  I did give him a prescription to reach out to [facility].  He knows not to push it too hard.  It is appropriate to get him into some therapy since he and his wife will be traveling out of the country with a lot of walking and hiking in April of this next year.  Will see him back in 6 weeks to see how he is doing overall but no x-rays are needed

## 2024-06-10 DIAGNOSIS — Z4732 Aftercare following explantation of hip joint prosthesis: Secondary | ICD-10-CM | POA: Diagnosis not present

## 2024-06-10 DIAGNOSIS — S76819S Strain of other specified muscles, fascia and tendons at thigh level, unspecified thigh, sequela: Secondary | ICD-10-CM | POA: Diagnosis not present

## 2024-06-13 DIAGNOSIS — Z4732 Aftercare following explantation of hip joint prosthesis: Secondary | ICD-10-CM | POA: Diagnosis not present

## 2024-06-13 DIAGNOSIS — S76819S Strain of other specified muscles, fascia and tendons at thigh level, unspecified thigh, sequela: Secondary | ICD-10-CM | POA: Diagnosis not present

## 2024-07-17 ENCOUNTER — Ambulatory Visit: Admitting: Orthopaedic Surgery

## 2024-07-17 DIAGNOSIS — Z96642 Presence of left artificial hip joint: Secondary | ICD-10-CM

## 2024-07-17 NOTE — Progress Notes (Signed)
 Patient is now over 6 weeks status post a left total hip replacement to treat significant left hip pain and arthritis.  He is an active 74 year old gentleman.  He has been in outpatient physical therapy because he said that really helps with his level of function and activities in general.  I think that is why he looks so good today at this visit.  He is walking without assistive device and no limp.  His left operative hip moves smoothly and fluidly with no issues at all.  His leg lengths are equal.  At this point we will see him back in 6 months unless there are issues.  At that visit we will have a standing AP pelvis and lateral of his left operative hip.  I did tell him he does not need antibiotics for routine dental cleanings.

## 2025-01-15 ENCOUNTER — Ambulatory Visit: Admitting: Orthopaedic Surgery
